# Patient Record
Sex: Female | Born: 1945 | Race: White | Hispanic: No | Smoking: Never smoker
Health system: Southern US, Community
[De-identification: ages and names within clinical notes are randomized; demographics above are authoritative.]

## PROBLEM LIST (undated history)

## (undated) DIAGNOSIS — M1990 Unspecified osteoarthritis, unspecified site: Secondary | ICD-10-CM

## (undated) DIAGNOSIS — C801 Malignant (primary) neoplasm, unspecified: Secondary | ICD-10-CM

## (undated) DIAGNOSIS — R011 Cardiac murmur, unspecified: Secondary | ICD-10-CM

## (undated) HISTORY — PX: NO PAST SURGERIES: SHX2092

## (undated) HISTORY — PX: TUBAL LIGATION: SHX77

## (undated) HISTORY — DX: Malignant (primary) neoplasm, unspecified: C80.1

## (undated) HISTORY — PX: TONSILLECTOMY: SUR1361

---

## 2004-11-03 ENCOUNTER — Ambulatory Visit (HOSPITAL_COMMUNITY): Admission: RE | Admit: 2004-11-03 | Discharge: 2004-11-03 | Payer: Self-pay | Admitting: Internal Medicine

## 2004-11-12 ENCOUNTER — Other Ambulatory Visit: Admission: RE | Admit: 2004-11-12 | Discharge: 2004-11-12 | Payer: Self-pay | Admitting: Internal Medicine

## 2004-11-18 ENCOUNTER — Encounter: Admission: RE | Admit: 2004-11-18 | Discharge: 2004-11-18 | Payer: Self-pay | Admitting: Internal Medicine

## 2004-12-23 ENCOUNTER — Ambulatory Visit (HOSPITAL_COMMUNITY): Admission: RE | Admit: 2004-12-23 | Discharge: 2004-12-23 | Payer: Self-pay | Admitting: *Deleted

## 2014-02-19 ENCOUNTER — Ambulatory Visit (INDEPENDENT_AMBULATORY_CARE_PROVIDER_SITE_OTHER): Payer: Medicare Other | Admitting: Family Medicine

## 2014-02-19 ENCOUNTER — Ambulatory Visit (INDEPENDENT_AMBULATORY_CARE_PROVIDER_SITE_OTHER): Payer: Medicare Other

## 2014-02-19 VITALS — BP 130/60 | HR 71 | Temp 97.7°F | Resp 16 | Ht 63.0 in | Wt 135.2 lb

## 2014-02-19 DIAGNOSIS — R238 Other skin changes: Secondary | ICD-10-CM

## 2014-02-19 DIAGNOSIS — M25511 Pain in right shoulder: Secondary | ICD-10-CM

## 2014-02-19 DIAGNOSIS — M7541 Impingement syndrome of right shoulder: Secondary | ICD-10-CM

## 2014-02-19 DIAGNOSIS — R209 Unspecified disturbances of skin sensation: Secondary | ICD-10-CM | POA: Diagnosis not present

## 2014-02-19 DIAGNOSIS — R200 Anesthesia of skin: Secondary | ICD-10-CM

## 2014-02-19 DIAGNOSIS — R233 Spontaneous ecchymoses: Secondary | ICD-10-CM

## 2014-02-19 DIAGNOSIS — R202 Paresthesia of skin: Secondary | ICD-10-CM

## 2014-02-19 DIAGNOSIS — J208 Acute bronchitis due to other specified organisms: Secondary | ICD-10-CM | POA: Diagnosis not present

## 2014-02-19 DIAGNOSIS — M75101 Unspecified rotator cuff tear or rupture of right shoulder, not specified as traumatic: Secondary | ICD-10-CM

## 2014-02-19 DIAGNOSIS — M25519 Pain in unspecified shoulder: Secondary | ICD-10-CM | POA: Diagnosis not present

## 2014-02-19 LAB — POCT CBC
Granulocyte percent: 65.7 % (ref 37–80)
HCT, POC: 42.5 % (ref 37.7–47.9)
Hemoglobin: 13.7 g/dL (ref 12.2–16.2)
Lymph, poc: 2.3 (ref 0.6–3.4)
MCH, POC: 28.9 pg (ref 27–31.2)
MCHC: 32.3 g/dL (ref 31.8–35.4)
MCV: 89.4 fL (ref 80–97)
MID (cbc): 0.7 (ref 0–0.9)
MPV: 7.2 fL (ref 0–99.8)
POC Granulocyte: 5.7 (ref 2–6.9)
POC LYMPH PERCENT: 26.6 %L (ref 10–50)
POC MID %: 7.7 %M (ref 0–12)
Platelet Count, POC: 237 10*3/uL (ref 142–424)
RBC: 4.75 M/uL (ref 4.04–5.48)
RDW, POC: 13.7 %
WBC: 8.7 10*3/uL (ref 4.6–10.2)

## 2014-02-19 MED ORDER — AZITHROMYCIN 250 MG PO TABS: ORAL_TABLET | ORAL | Status: DC

## 2014-02-19 NOTE — Progress Notes (Signed)
 Chief Complaint:  Chief Complaint  Patient presents with  . Shoulder Pain    x3 months; pt states she has been having problems with her right shoulder  . Nasal Congestion    x2 weeks  . Fever    x2 weeks; pt did not take her temp but knows she had an fever   . Chills    x2 weeks  . Cough    green phelgm    HPI: Amber Cooley is a 68 y.o. female who is here for :  1. Cold sxs started 8 weeks ago , lasted 6 weeks. She has not seen her PCP in a long time so they would consider her a new patient since she has not been there. The first cold she did not do much, she took cough meds and a lot of bufferin. She took 3 bottles of tussin and lots of  bufferin for  HA. She has bruises from the bufferin. Almost 2 weeks after her sxs improve  her sxs returned , she has had a runny nose, head feels stuffed up, she feels like she has the beginnings of a sore throat. She is not producing much when she coughs , mostly mucus from nose. She denies ear pain.  She has taken Tussie cough meds. She does not like taking medicines. She tried to take Vitamin C  But that has not helped. No CP or SOB, wheezing. She thinks she is UTD on her TDaP but not sure.   2.  Right shoulder pain has been bothering  her for the last 3-4 months. She used to sleep on it but has pain when she lies on it , she has not used it as much due to pain. THE pain is not just at night, it can be when she turns her arm or moves her shoulder the wrong way. She ahs 2 jobs, both involves a lot of lifting.  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 SHe has shoudler pain with lifting her coffee cup. She feels it in her hands, has had difficulty with grip of her coffee cup and also pouring of the . She has to used her left hand to stabilize her right arm. She denies any neck pain.   She has carpal tunnel like sxs also, sometimes the pain radiates from her wrist up to her elbow  She had precancer in her cervix in her early 37s . She had cryo and nothing else. Mother passed from ovarian Cancer.   PCP: Dr Minna Antis   Past Medical History  Diagnosis Date  . Cancer    Past Surgical History  Procedure Laterality Date  . Tubal ligation     History   Social History  . Marital Status: Divorced    Spouse Name: N/A    Number of Children: N/A  . Years of Education: N/A   Social History Main Topics  . Smoking status: Never Smoker   . Smokeless tobacco: None  . Alcohol Use: Yes  . Drug Use: No  . Sexual Activity: None   Other Topics Concern  . None   Social History Narrative  . None   Family History  Problem Relation Age of Onset  . Cancer Mother    No Known Allergies  Prior to Admission medications   Medication Sig Start Date End Date Taking? Authorizing Provider  Hydrocodone-Chlorpheniramine (CHLORPHENIRAMINE-HYDROCODONE PO) Take 5 mg by mouth.   Yes Historical Provider, MD  naproxen sodium (ANAPROX) 220 MG tablet Take 220 mg by mouth as needed.   Yes Historical Provider, MD     ROS: The patient denies night sweats, unintentional weight loss, chest pain, palpitations, wheezing, dyspnea on exertion, nausea, vomiting, abdominal pain, dysuria, hematuria, melena, + numbness, weakness, or tingling.   All other systems have been reviewed and were otherwise negative with the exception of those mentioned in the HPI and as above.    PHYSICAL EXAM: Filed Vitals:   02/19/14 1629  BP: 130/60  Pulse: 71  Temp: 97.7 F (36.5 C)  Resp: 16   Filed Vitals:   02/19/14 1629  Height: 5\' 3"  (1.6 m)  Weight: 135 lb 3.2 oz (61.326 kg)   Body mass index is 23.96 kg/(m^2).  General: Alert, no acute distress HEENT:  Normocephalic, atraumatic, oropharynx patent. EOMI, PERRLA Cardiovascular:  Regular rate and rhythm, no rubs murmurs or gallops.  No Carotid bruits, radial  pulse intact. No pedal edema.  Respiratory: Clear to auscultation bilaterally.  No wheezes, rales, or rhonchi.  No cyanosis, no use of accessory musculature GI: No organomegaly, abdomen is soft and non-tender, positive bowel sounds.  No masses. Skin: No rashes. Neurologic: Facial musculature symmetric. Psychiatric: Patient is appropriate throughout our interaction. Lymphatic: No cervical lymphadenopathy Musculoskeletal: Gait intact. Neck exam-full , + spurling Right shoulder-+ minimal swelling anterior aspect;  no hypo or hypertrophy, 5/5 strength, neg Empty can, + Hawkins/Neers, neg Speeds, decrease ROM in IR and ER Wrist-no appreciable deformity, + phalens, + tinels, 5/5/ strength, no appreciable msk wasting of thenar eminences,interosseus intact  LABS: Results for orders placed in visit on 02/19/14  POCT CBC      Result Value Ref Range   WBC 8.7  4.6 - 10.2 K/uL   Lymph, poc 2.3  0.6 - 3.4   POC LYMPH PERCENT 26.6  10 - 50 %L   MID (cbc) 0.7  0 - 0.9   POC MID % 7.7  0 - 12 %M   POC Granulocyte 5.7  2 - 6.9   Granulocyte percent 65.7  37 - 80 %G   RBC 4.75  4.04 - 5.48 M/uL   Hemoglobin 13.7  12.2 - 16.2 g/dL   HCT, POC 42.5  37.7 - 47.9 %   MCV 89.4  80 - 97 fL   MCH, POC 28.9  27 - 31.2 pg   MCHC 32.3  31.8 - 35.4 g/dL   RDW, POC 13.7     Platelet Count, POC 237  142 - 424 K/uL   MPV 7.2  0 - 99.8 fL     EKG/XRAY:   Primary read interpreted by Dr. Marin [facility] at [facility]. 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  . Pain in joint, shoulder region, right   . Numbness and tingling in right hand   . Easy bruising   . Paresthesia   . Acute bronchitis due to other specified organisms Yes  . Rotator cuff impingement syndrome, right    Mrs. Horn is a pleasant Right handed 68 year old female with recurrent  acute bronchitis and also  3 month hx of anterolateral shoulder pain and  also paresthesia to her arms and hands. The distributionis hard to localize since  she also has carpal tunnel like sxs and also a positive Spurling test on neck exam. I did not illicit any sxs or signs of ulanr nerve entrapment on history or exam. Since her paresthesia is impairing her function, she has to use her left hand to help with holding her coffee cup. I will go ahead and order a nerve conduction test. She states she would rather do that prior to going to see specialist.  Nerve conduction test pedning ROM exercises given for Rotator Cuff impingement. Does not want any pain meds Z pack given for long standing cough, will cover for pertussis in case her TDaP is not UTD.  OTC cough meds.  Labs pending: CMP , CBC was nl, platelets normal. Advise to avoid NSAIDs for now. IF CMP is normal then consider mobic prn for pain.  F/u in 1 month after nerve conduction test.   Gross sideeffects, risk and benefits, and alternatives of medications d/w patient. Patient is aware that all medications have potential sideeffects and we are unable to predict every sideeffect or drug-drug interaction that may occur.  , Farrell, DO 02/20/2014 3:25 PM

## 2014-02-19 NOTE — Patient Instructions (Signed)
Impingement Syndrome, Rotator Cuff, Bursitis with Rehab Impingement syndrome is a condition that involves inflammation of the tendons of the rotator cuff and the subacromial bursa, that causes pain in the shoulder. The rotator cuff consists of four tendons and muscles that control much of the shoulder and upper arm function. The subacromial bursa is a fluid filled sac that helps reduce friction between the rotator cuff and one of the bones of the shoulder (acromion). Impingement syndrome is usually an overuse injury that causes swelling of the bursa (bursitis), swelling of the tendon (tendonitis), and/or a tear of the tendon (strain). Strains are classified into three categories. Grade 1 strains cause pain, but the tendon is not lengthened. Grade 2 strains include a lengthened ligament, due to the ligament being stretched or partially ruptured. With grade 2 strains there is still function, although the function may be decreased. Grade 3 strains include a complete tear of the tendon or muscle, and function is usually impaired. SYMPTOMS   Pain around the shoulder, often at the outer portion of the upper arm.  Pain that gets worse with shoulder function, especially when reaching overhead or lifting.  Sometimes, aching when not using the arm.  Pain that wakes you up at night.  Sometimes, tenderness, swelling, warmth, or redness over the affected area.  Loss of strength.  Limited motion of the shoulder, especially reaching behind the back (to the back pocket or to unhook bra) or across your body.  Crackling sound (crepitation) when moving the arm.  Biceps tendon pain and inflammation (in the front of the shoulder). Worse when bending the elbow or lifting. CAUSES  Impingement syndrome is often an overuse injury, in which chronic (repetitive) motions cause the tendons or bursa to become inflamed. A strain occurs when a force is paced on the tendon or muscle that is greater than it can withstand.  Common mechanisms of injury include: Stress from sudden increase in duration, frequency, or intensity of training.  Direct hit (trauma) to the shoulder.  Aging, erosion of the tendon with normal use.  Bony bump on shoulder (acromial spur). RISK INCREASES WITH:  Contact sports (football, wrestling, boxing).  Throwing sports (baseball, tennis, volleyball).  Weightlifting and bodybuilding.  Heavy labor.  Previous injury to the rotator cuff, including impingement.  Poor shoulder strength and flexibility.  Failure to warm up properly before activity.  Inadequate protective equipment.  Old age.  Bony bump on shoulder (acromial spur). PREVENTION   Warm up and stretch properly before activity.  Allow for adequate recovery between workouts.  Maintain physical fitness:  Strength, flexibility, and endurance.  Cardiovascular fitness.  Learn and use proper exercise technique. PROGNOSIS  If treated properly, impingement syndrome usually goes away within 6 weeks. Sometimes surgery is required.  RELATED COMPLICATIONS   Longer healing time if not properly treated, or if not given enough time to heal.  Recurring symptoms, that result in a chronic condition.  Shoulder stiffness, frozen shoulder, or loss of motion.  Rotator cuff tendon tear.  Recurring symptoms, especially if activity is resumed too soon, with overuse, with a direct blow, or when using poor technique. TREATMENT  Treatment first involves the use of ice and medicine, to reduce pain and inflammation. The use of strengthening and stretching exercises may help reduce pain with activity. These exercises may be performed at home or with a therapist. If non-surgical treatment is unsuccessful after more than 6 months, surgery may be advised. After surgery and rehabilitation, activity is usually possible in 3 months.  MEDICATION  If pain medicine is needed, nonsteroidal anti-inflammatory medicines (aspirin and  ibuprofen), or other minor pain relievers (acetaminophen), are often advised.  Do not take pain medicine for 7 days before surgery.  Prescription pain relievers may be given, if your caregiver thinks they are needed. Use only as directed and only as much as you need.  Corticosteroid injections may be given by your caregiver. These injections should be reserved for the most serious cases, because they may only be given a certain number of times. HEAT AND COLD  Cold treatment (icing) should be applied for 10 to 15 minutes every 2 to 3 hours for inflammation and pain, and immediately after activity that aggravates your symptoms. Use ice packs or an ice massage.  Heat treatment may be used before performing stretching and strengthening activities prescribed by your caregiver, physical therapist, or athletic trainer. Use a heat pack or a warm water soak. SEEK MEDICAL CARE IF:   Symptoms get worse or do not improve in 4 to 6 weeks, despite treatment.  New, unexplained symptoms develop. (Drugs used in treatment may produce side effects.) EXERCISES  RANGE OF MOTION (ROM) AND STRETCHING EXERCISES - Impingement Syndrome (Rotator Cuff  Tendinitis, Bursitis) These exercises may help you when beginning to rehabilitate your injury. Your symptoms may go away with or without further involvement from your physician, physical therapist or athletic trainer. While completing these exercises, remember:   Restoring tissue flexibility helps normal motion to return to the joints. This allows healthier, less painful movement and activity.  An effective stretch should be held for at least 30 seconds.  A stretch should never be painful. You should only feel a gentle lengthening or release in the stretched tissue. STRETCH - Flexion, Standing  Stand with good posture. With an underhand grip on your right / left hand, and an overhand grip on the opposite hand, grasp a broomstick or cane so that your hands are a  little more than shoulder width apart.  Keeping your right / left elbow straight and shoulder muscles relaxed, push the stick with your opposite hand, to raise your right / left arm in front of your body and then overhead. Raise your arm until you feel a stretch in your right / left shoulder, but before you have increased shoulder pain.  Try to avoid shrugging your right / left shoulder as your arm rises, by keeping your shoulder blade tucked down and toward your mid-back spine. Hold for __________ seconds.  Slowly return to the starting position. Repeat __________ times. Complete this exercise __________ times per day. STRETCH - Abduction, Supine  Lie on your back. With an underhand grip on your right / left hand and an overhand grip on the opposite hand, grasp a broomstick or cane so that your hands are a little more than shoulder width apart.  Keeping your right / left elbow straight and your shoulder muscles relaxed, push the stick with your opposite hand, to raise your right / left arm out to the side of your body and then overhead. Raise your arm until you feel a stretch in your right / left shoulder, but before you have increased shoulder pain.  Try to avoid shrugging your right / left shoulder as your arm rises, by keeping your shoulder blade tucked down and toward your mid-back spine. Hold for __________ seconds.  Slowly return to the starting position. Repeat __________ times. Complete this exercise __________ times per day. ROM - Flexion, Active-Assisted  Lie on your back.  You may bend your knees for comfort.  Grasp a broomstick or cane so your hands are about shoulder width apart. Your right / left hand should grip the end of the stick, so that your hand is positioned "thumbs-up," as if you were about to shake hands.  Using your healthy arm to lead, raise your right / left arm overhead, until you feel a gentle stretch in your shoulder. Hold for __________ seconds.  Use the stick  to assist in returning your right / left arm to its starting position. Repeat __________ times. Complete this exercise __________ times per day.  ROM - Internal Rotation, Supine   Lie on your back on a firm surface. Place your right / left elbow about 60 degrees away from your side. Elevate your elbow with a folded towel, so that the elbow and shoulder are the same height.  Using a broomstick or cane and your strong arm, pull your right / left hand toward your body until you feel a gentle stretch, but no increase in your shoulder pain. Keep your shoulder and elbow in place throughout the exercise.  Hold for __________ seconds. Slowly return to the starting position. Repeat __________ times. Complete this exercise __________ times per day. STRETCH - Internal Rotation  Place your right / left hand behind your back, palm up.  Throw a towel or belt over your opposite shoulder. Grasp the towel with your right / left hand.  While keeping an upright posture, gently pull up on the towel, until you feel a stretch in the front of your right / left shoulder.  Avoid shrugging your right / left shoulder as your arm rises, by keeping your shoulder blade tucked down and toward your mid-back spine.  Hold for __________ seconds. Release the stretch, by lowering your healthy hand. Repeat __________ times. Complete this exercise __________ times per day. ROM - Internal Rotation   Using an underhand grip, grasp a stick behind your back with both hands.  While standing upright with good posture, slide the stick up your back until you feel a mild stretch in the front of your shoulder.  Hold for __________ seconds. Slowly return to your starting position. Repeat __________ times. Complete this exercise __________ times per day.  STRETCH - Posterior Shoulder Capsule   Stand or sit with good posture. Grasp your right / left elbow and draw it across your chest, keeping it at the same height as your  shoulder.  Pull your elbow, so your upper arm comes in closer to your chest. Pull until you feel a gentle stretch in the back of your shoulder.  Hold for __________ seconds. Repeat __________ times. Complete this exercise __________ times per day. STRENGTHENING EXERCISES - Impingement Syndrome (Rotator Cuff Tendinitis, Bursitis) These exercises may help you when beginning to rehabilitate your injury. They may resolve your symptoms with or without further involvement from your physician, physical therapist or athletic trainer. While completing these exercises, remember:  Muscles can gain both the endurance and the strength needed for everyday activities through controlled exercises.  Complete these exercises as instructed by your physician, physical therapist or athletic trainer. Increase the resistance and repetitions only as guided.  You may experience muscle soreness or fatigue, but the pain or discomfort you are trying to eliminate should never worsen during these exercises. If this pain does get worse, stop and make sure you are following the directions exactly. If the pain is still present after adjustments, discontinue the exercise until you can discuss   the trouble with your clinician.  During your recovery, avoid activity or exercises which involve actions that place your injured hand or elbow above your head or behind your back or head. These positions stress the tissues which you are trying to heal. STRENGTH - Scapular Depression and Adduction   With good posture, sit on a firm chair. Support your arms in front of you, with pillows, arm rests, or on a table top. Have your elbows in line with the sides of your body.  Gently draw your shoulder blades down and toward your mid-back spine. Gradually increase the tension, without tensing the muscles along the top of your shoulders and the back of your neck.  Hold for __________ seconds. Slowly release the tension and relax your muscles  completely before starting the next repetition.  After you have practiced this exercise, remove the arm support and complete the exercise in standing as well as sitting position. Repeat __________ times. Complete this exercise __________ times per day.  STRENGTH - Shoulder Abductors, Isometric  With good posture, stand or sit about 4-6 inches from a wall, with your right / left side facing the wall.  Bend your right / left elbow. Gently press your right / left elbow into the wall. Increase the pressure gradually, until you are pressing as hard as you can, without shrugging your shoulder or increasing any shoulder discomfort.  Hold for __________ seconds.  Release the tension slowly. Relax your shoulder muscles completely before you begin the next repetition. Repeat __________ times. Complete this exercise __________ times per day.  STRENGTH - External Rotators, Isometric  Keep your right / left elbow at your side and bend it 90 degrees.  Step into a door frame so that the outside of your right / left wrist can press against the door frame without your upper arm leaving your side.  Gently press your right / left wrist into the door frame, as if you were trying to swing the back of your hand away from your stomach. Gradually increase the tension, until you are pressing as hard as you can, without shrugging your shoulder or increasing any shoulder discomfort.  Hold for __________ seconds.  Release the tension slowly. Relax your shoulder muscles completely before you begin the next repetition. Repeat __________ times. Complete this exercise __________ times per day.  STRENGTH - Supraspinatus   Stand or sit with good posture. Grasp a __________ weight, or an exercise band or tubing, so that your hand is "thumbs-up," like you are shaking hands.  Slowly lift your right / left arm in a "V" away from your thigh, diagonally into the space between your side and straight ahead. Lift your hand to  shoulder height or as far as you can, without increasing any shoulder pain. At first, many people do not lift their hands above shoulder height.  Avoid shrugging your right / left shoulder as your arm rises, by keeping your shoulder blade tucked down and toward your mid-back spine.  Hold for __________ seconds. Control the descent of your hand, as you slowly return to your starting position. Repeat __________ times. Complete this exercise __________ times per day.  STRENGTH - External Rotators  Secure a rubber exercise band or tubing to a fixed object (table, pole) so that it is at the same height as your right / left elbow when you are standing or sitting on a firm surface.  Stand or sit so that the secured exercise band is at your uninjured side.  Bend   your right / left elbow 90 degrees. Place a folded towel or small pillow under your right / left arm, so that your elbow is a few inches away from your side.  Keeping the tension on the exercise band, pull it away from your body, as if pivoting on your elbow. Be sure to keep your body steady, so that the movement is coming only from your rotating shoulder.  Hold for __________ seconds. Release the tension in a controlled manner, as you return to the starting position. Repeat __________ times. Complete this exercise __________ times per day.  STRENGTH - Internal Rotators   Secure a rubber exercise band or tubing to a fixed object (table, pole) so that it is at the same height as your right / left elbow when you are standing or sitting on a firm surface.  Stand or sit so that the secured exercise band is at your right / left side.  Bend your elbow 90 degrees. Place a folded towel or small pillow under your right / left arm so that your elbow is a few inches away from your side.  Keeping the tension on the exercise band, pull it across your body, toward your stomach. Be sure to keep your body steady, so that the movement is coming only from  your rotating shoulder.  Hold for __________ seconds. Release the tension in a controlled manner, as you return to the starting position. Repeat __________ times. Complete this exercise __________ times per day.  STRENGTH - Scapular Protractors, Standing   Stand arms length away from a wall. Place your hands on the wall, keeping your elbows straight.  Begin by dropping your shoulder blades down and toward your mid-back spine.  To strengthen your protractors, keep your shoulder blades down, but slide them forward on your rib cage. It will feel as if you are lifting the back of your rib cage away from the wall. This is a subtle motion and can be challenging to complete. Ask your caregiver for further instruction, if you are not sure you are doing the exercise correctly.  Hold for __________ seconds. Slowly return to the starting position, resting the muscles completely before starting the next repetition. Repeat __________ times. Complete this exercise __________ times per day. STRENGTH - Scapular Protractors, Supine  Lie on your back on a firm surface. Extend your right / left arm straight into the air while holding a __________ weight in your hand.  Keeping your head and back in place, lift your shoulder off the floor.  Hold for __________ seconds. Slowly return to the starting position, and allow your muscles to relax completely before starting the next repetition. Repeat __________ times. Complete this exercise __________ times per day. STRENGTH - Scapular Protractors, Quadruped  Get onto your hands and knees, with your shoulders directly over your hands (or as close as you can be, comfortably).  Keeping your elbows locked, lift the back of your rib cage up into your shoulder blades, so your mid-back rounds out. Keep your neck muscles relaxed.  Hold this position for __________ seconds. Slowly return to the starting position and allow your muscles to relax completely before starting the  next repetition. Repeat __________ times. Complete this exercise __________ times per day.  STRENGTH - Scapular Retractors  Secure a rubber exercise band or tubing to a fixed object (table, pole), so that it is at the height of your shoulders when you are either standing, or sitting on a firm armless chair.  With a   palm down grip, grasp an end of the band in each hand. Straighten your elbows and lift your hands straight in front of you, at shoulder height. Step back, away from the secured end of the band, until it becomes tense.  Squeezing your shoulder blades together, draw your elbows back toward your sides, as you bend them. Keep your upper arms lifted away from your body throughout the exercise.  Hold for __________ seconds. Slowly ease the tension on the band, as you reverse the directions and return to the starting position. Repeat __________ times. Complete this exercise __________ times per day. STRENGTH - Shoulder Extensors   Secure a rubber exercise band or tubing to a fixed object (table, pole) so that it is at the height of your shoulders when you are either standing, or sitting on a firm armless chair.  With a thumbs-up grip, grasp an end of the band in each hand. Straighten your elbows and lift your hands straight in front of you, at shoulder height. Step back, away from the secured end of the band, until it becomes tense.  Squeezing your shoulder blades together, pull your hands down to the sides of your thighs. Do not allow your hands to go behind you.  Hold for __________ seconds. Slowly ease the tension on the band, as you reverse the directions and return to the starting position. Repeat __________ times. Complete this exercise __________ times per day.  STRENGTH - Scapular Retractors and External Rotators   Secure a rubber exercise band or tubing to a fixed object (table, pole) so that it is at the height as your shoulders, when you are either standing, or sitting on a  firm armless chair.  With a palm down grip, grasp an end of the band in each hand. Bend your elbows 90 degrees and lift your elbows to shoulder height, at your sides. Step back, away from the secured end of the band, until it becomes tense.  Squeezing your shoulder blades together, rotate your shoulders so that your upper arms and elbows remain stationary, but your fists travel upward to head height.  Hold for __________ seconds. Slowly ease the tension on the band, as you reverse the directions and return to the starting position. Repeat __________ times. Complete this exercise __________ times per day.  STRENGTH - Scapular Retractors and External Rotators, Rowing   Secure a rubber exercise band or tubing to a fixed object (table, pole) so that it is at the height of your shoulders, when you are either standing, or sitting on a firm armless chair.  With a palm down grip, grasp an end of the band in each hand. Straighten your elbows and lift your hands straight in front of you, at shoulder height. Step back, away from the secured end of the band, until it becomes tense.  Step 1: Squeeze your shoulder blades together. Bending your elbows, draw your hands to your chest, as if you are rowing a boat. At the end of this motion, your hands and elbow should be at shoulder height and your elbows should be out to your sides.  Step 2: Rotate your shoulders, to raise your hands above your head. Your forearms should be vertical and your upper arms should be horizontal.  Hold for __________ seconds. Slowly ease the tension on the band, as you reverse the directions and return to the starting position. Repeat __________ times. Complete this exercise __________ times per day.  STRENGTH - Scapular Depressors  Find a sturdy chair   without wheels, such as a dining room chair.  Keeping your feet on the floor, and your hands on the chair arms, lift your bottom up from the seat, and lock your elbows.  Keeping  your elbows straight, allow gravity to pull your body weight down. Your shoulders will rise toward your ears.  Raise your body against gravity by drawing your shoulder blades down your back, shortening the distance between your shoulders and ears. Although your feet should always maintain contact with the floor, your feet should progressively support less body weight, as you get stronger.  Hold for __________ seconds. In a controlled and slow manner, lower your body weight to begin the next repetition. Repeat __________ times. Complete this exercise __________ times per day.  Document Released: 05/03/2005 Document Revised: 07/26/2011 Document Reviewed: 08/15/2008 [facility] Patient Information 2015 [place], [place]. This information is not intended to replace advice given to you by your health care provider. Make sure you discuss any questions you have with your health care provider. Acute Bronchitis Bronchitis is inflammation of the airways that extend from the windpipe into the lungs (bronchi). The inflammation often causes mucus to develop. This leads to a cough, which is the most common symptom of bronchitis.  In acute bronchitis, the condition usually develops suddenly and goes away over time, usually in a couple weeks. Smoking, allergies, and asthma can make bronchitis worse. Repeated episodes of bronchitis may cause further lung problems.  CAUSES Acute bronchitis is most often caused by the same virus that causes a cold. The virus can spread from person to person (contagious) through coughing, sneezing, and touching contaminated objects. SIGNS AND SYMPTOMS   Cough.   Fever.   Coughing up mucus.   Body aches.   Chest congestion.   Chills.   Shortness of breath.   Sore throat.  DIAGNOSIS  Acute bronchitis is usually diagnosed through a physical exam. Your health care provider will also ask you questions about your medical history. Tests, such as chest X-rays, are sometimes  done to rule out other conditions.  TREATMENT  Acute bronchitis usually goes away in a couple weeks. Oftentimes, no medical treatment is necessary. Medicines are sometimes given for relief of fever or cough. Antibiotic medicines are usually not needed but may be prescribed in certain situations. In some cases, an inhaler may be recommended to help reduce shortness of breath and control the cough. A cool mist vaporizer may also be used to help thin bronchial secretions and make it easier to clear the chest.  HOME CARE INSTRUCTIONS  Get plenty of rest.   Drink enough fluids to keep your urine clear or pale yellow (unless you have a medical condition that requires fluid restriction). Increasing fluids may help thin your respiratory secretions (sputum) and reduce chest congestion, and it will prevent dehydration.   Take medicines only as directed by your health care provider.  If you were prescribed an antibiotic medicine, finish it all even if you start to feel better.  Avoid smoking and secondhand smoke. Exposure to cigarette smoke or irritating chemicals will make bronchitis worse. If you are a smoker, consider using nicotine gum or skin patches to help control withdrawal symptoms. Quitting smoking will help your lungs heal faster.   Reduce the chances of another bout of acute bronchitis by washing your hands frequently, avoiding people with cold symptoms, and trying not to touch your hands to your mouth, nose, or eyes.   Keep all follow-up visits as directed by your health care provider.  SEEK MEDICAL CARE IF: Your symptoms do not improve after 1 week of treatment.  SEEK IMMEDIATE MEDICAL CARE IF:  You develop an increased fever or chills.   You have chest pain.   You have severe shortness of breath.  You have bloody sputum.   You develop dehydration.  You faint or repeatedly feel like you are going to pass out.  You develop repeated vomiting.  You develop a severe  headache. MAKE SURE YOU:   Understand these instructions.  Will watch your condition.  Will get help right away if you are not doing well or get worse. Document Released: 06/10/2004 Document Revised: 09/17/2013 Document Reviewed: 10/24/2012 [facility] Patient Information 2015 [place], [place]. This information is not intended to replace advice given to you by your health care provider. Make sure you discuss any questions you have with your health care provider.

## 2014-02-20 LAB — COMPLETE METABOLIC PANEL WITH GFR
AST: 14 U/L (ref 0–37)
Albumin: 4.4 g/dL (ref 3.5–5.2)
Alkaline Phosphatase: 68 U/L (ref 39–117)
GFR, Est African American: 89 mL/min
GFR, Est Non African American: 77 mL/min

## 2014-02-20 LAB — COMPLETE METABOLIC PANEL WITHOUT GFR
ALT: 11 U/L (ref 0–35)
BUN: 18 mg/dL (ref 6–23)
CO2: 29 meq/L (ref 19–32)
Calcium: 9.5 mg/dL (ref 8.4–10.5)
Chloride: 102 meq/L (ref 96–112)
Creat: 0.79 mg/dL (ref 0.50–1.10)
Glucose, Bld: 90 mg/dL (ref 70–99)
Potassium: 4.8 meq/L (ref 3.5–5.3)
Sodium: 138 meq/L (ref 135–145)
Total Bilirubin: 0.4 mg/dL (ref 0.2–1.2)
Total Protein: 7.2 g/dL (ref 6.0–8.3)

## 2014-02-21 ENCOUNTER — Encounter: Payer: Self-pay | Admitting: Family Medicine

## 2015-09-02 IMAGING — CR DG CERVICAL SPINE 2 OR 3 VIEWS
2 series · 2 of 2 positions shown · non-contrast
Comparison: None.

CLINICAL DATA: Chronic neck and shoulder pain. Chest pain. Numbness
and tingling in right hand.

EXAM:
RIGHT SHOULDER - 2+ VIEW; CHEST - 2 VIEW; CERVICAL SPINE - 2-3 VIEW

[lateral]
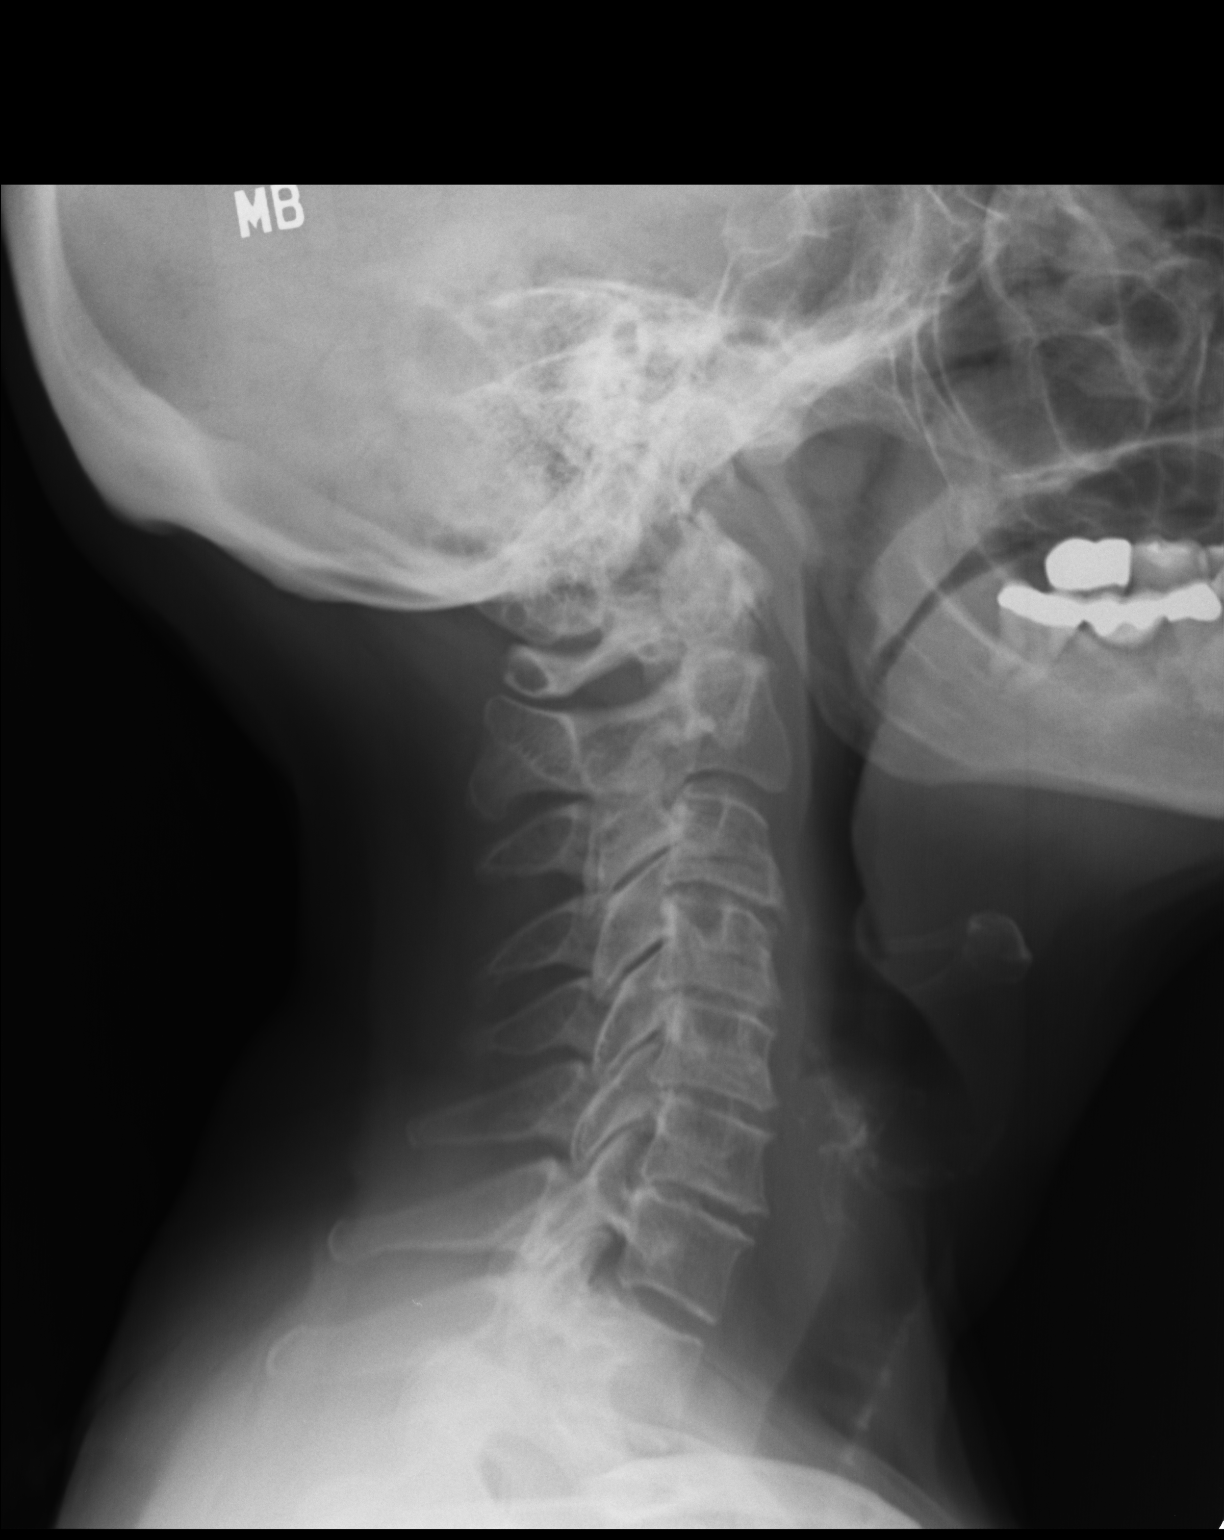

[AP]
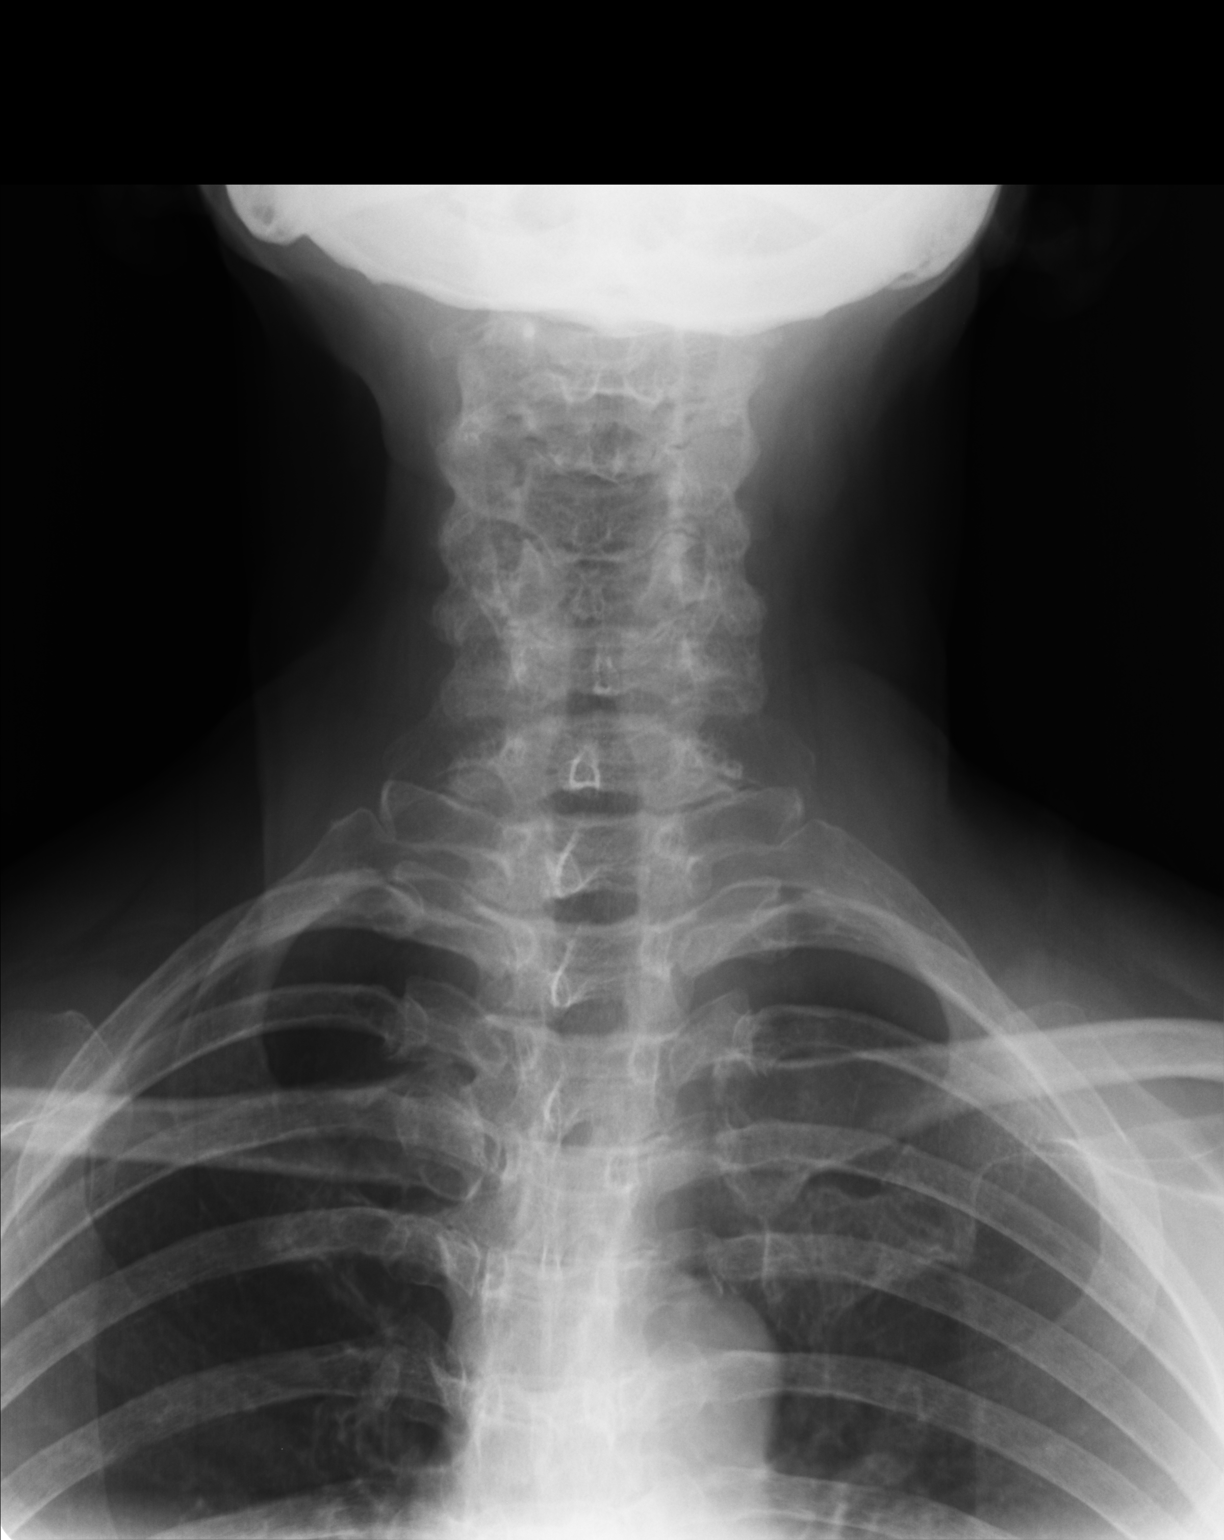

[2 of 2 positions shown; findings below may reference images not displayed]

FINDINGS: Two-view chest x-ray:

The cardiac silhouette, mediastinal and hilar contours are within
normal limits. The lungs are clear. No pleural effusion. The bony
thorax is intact.

Two view cervical spine:

Moderate degenerative cervical spondylosis with multilevel disc
disease and facet disease. Multilevel degenerative subluxations. No
acute bony findings or destructive bony changes. No abnormal
prevertebral soft tissue swelling. The facets are normally aligned.
The lung apices are clear. Small cervical ribs are noted.

Three-view right shoulder:

The joint spaces are maintained. Mild degenerative changes. No acute
fracture. The right lung is clear.
IMPRESSION: No acute cardiopulmonary findings.

Moderate to advanced degenerative cervical spondylosis but no acute
findings.

Mild degenerative changes but no acute abnormality involving the
right shoulder.

## 2015-09-02 IMAGING — CR DG SHOULDER 2+V*R*
3 series · 3 of 3 positions shown · non-contrast
Comparison: None.

CLINICAL DATA: Chronic neck and shoulder pain. Chest pain. Numbness
and tingling in right hand.

EXAM:
RIGHT SHOULDER - 2+ VIEW; CHEST - 2 VIEW; CERVICAL SPINE - 2-3 VIEW

[AP]
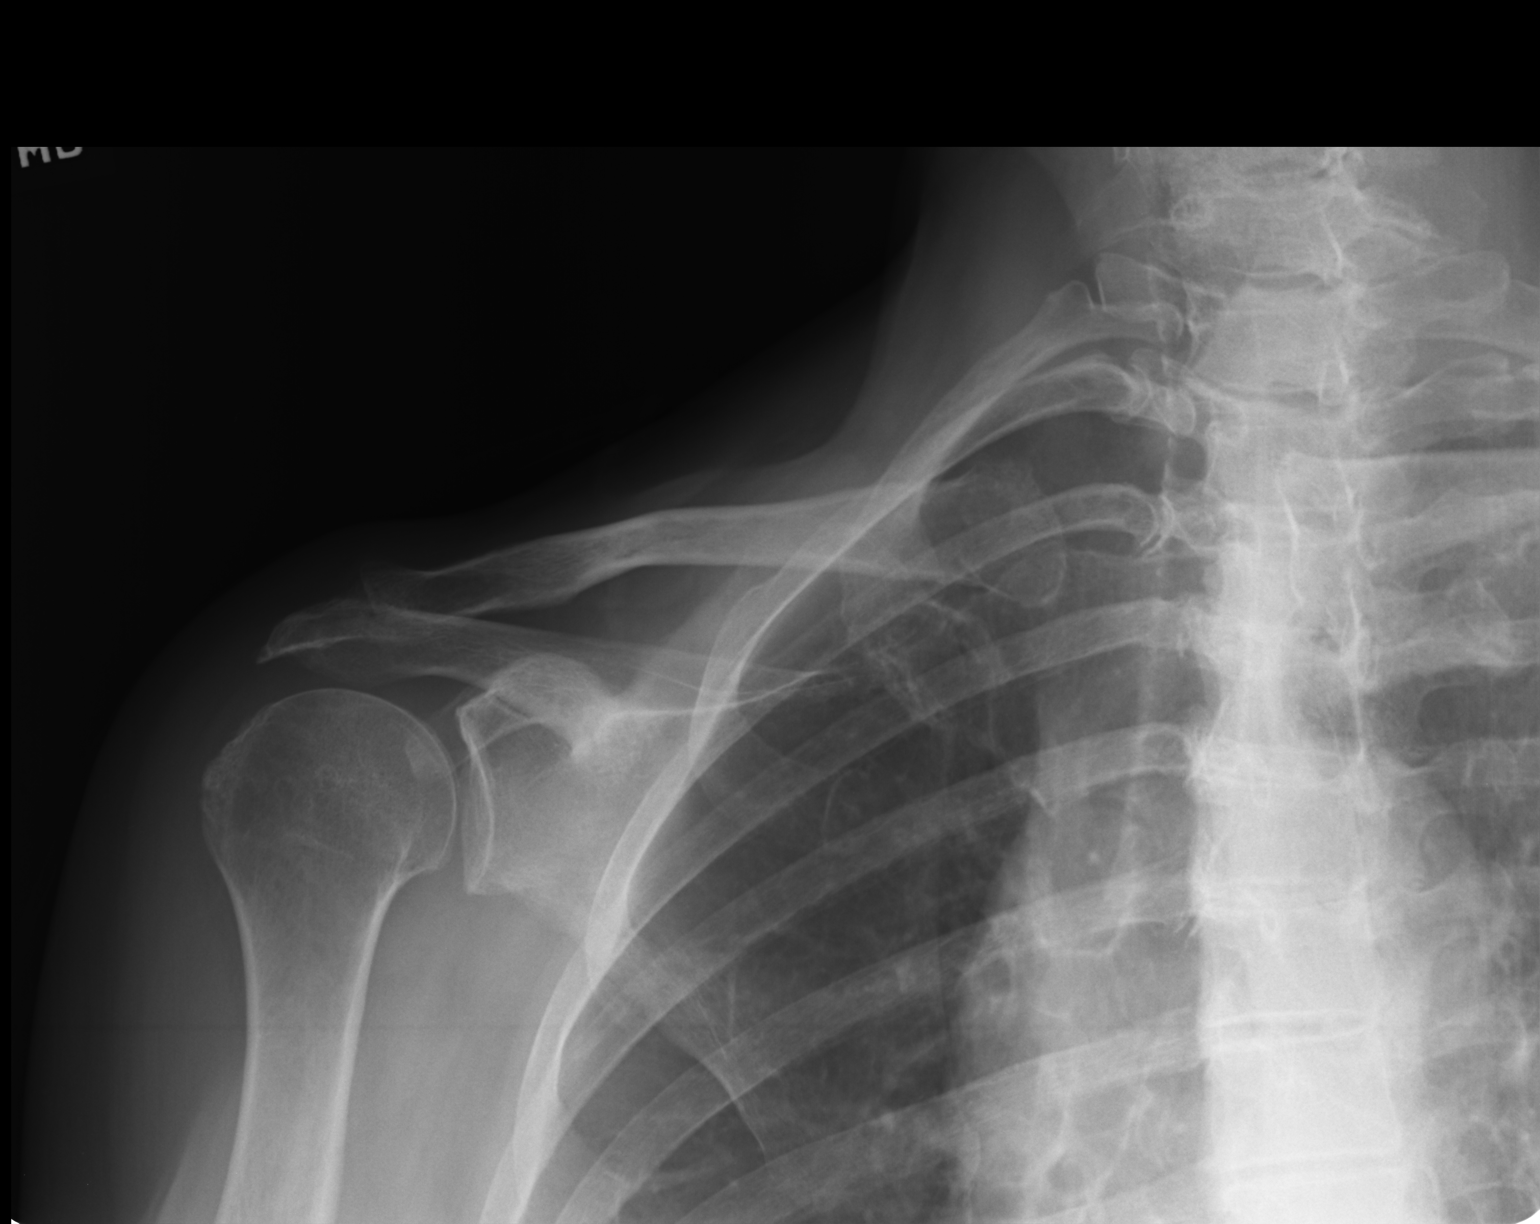

[ap ext rot]
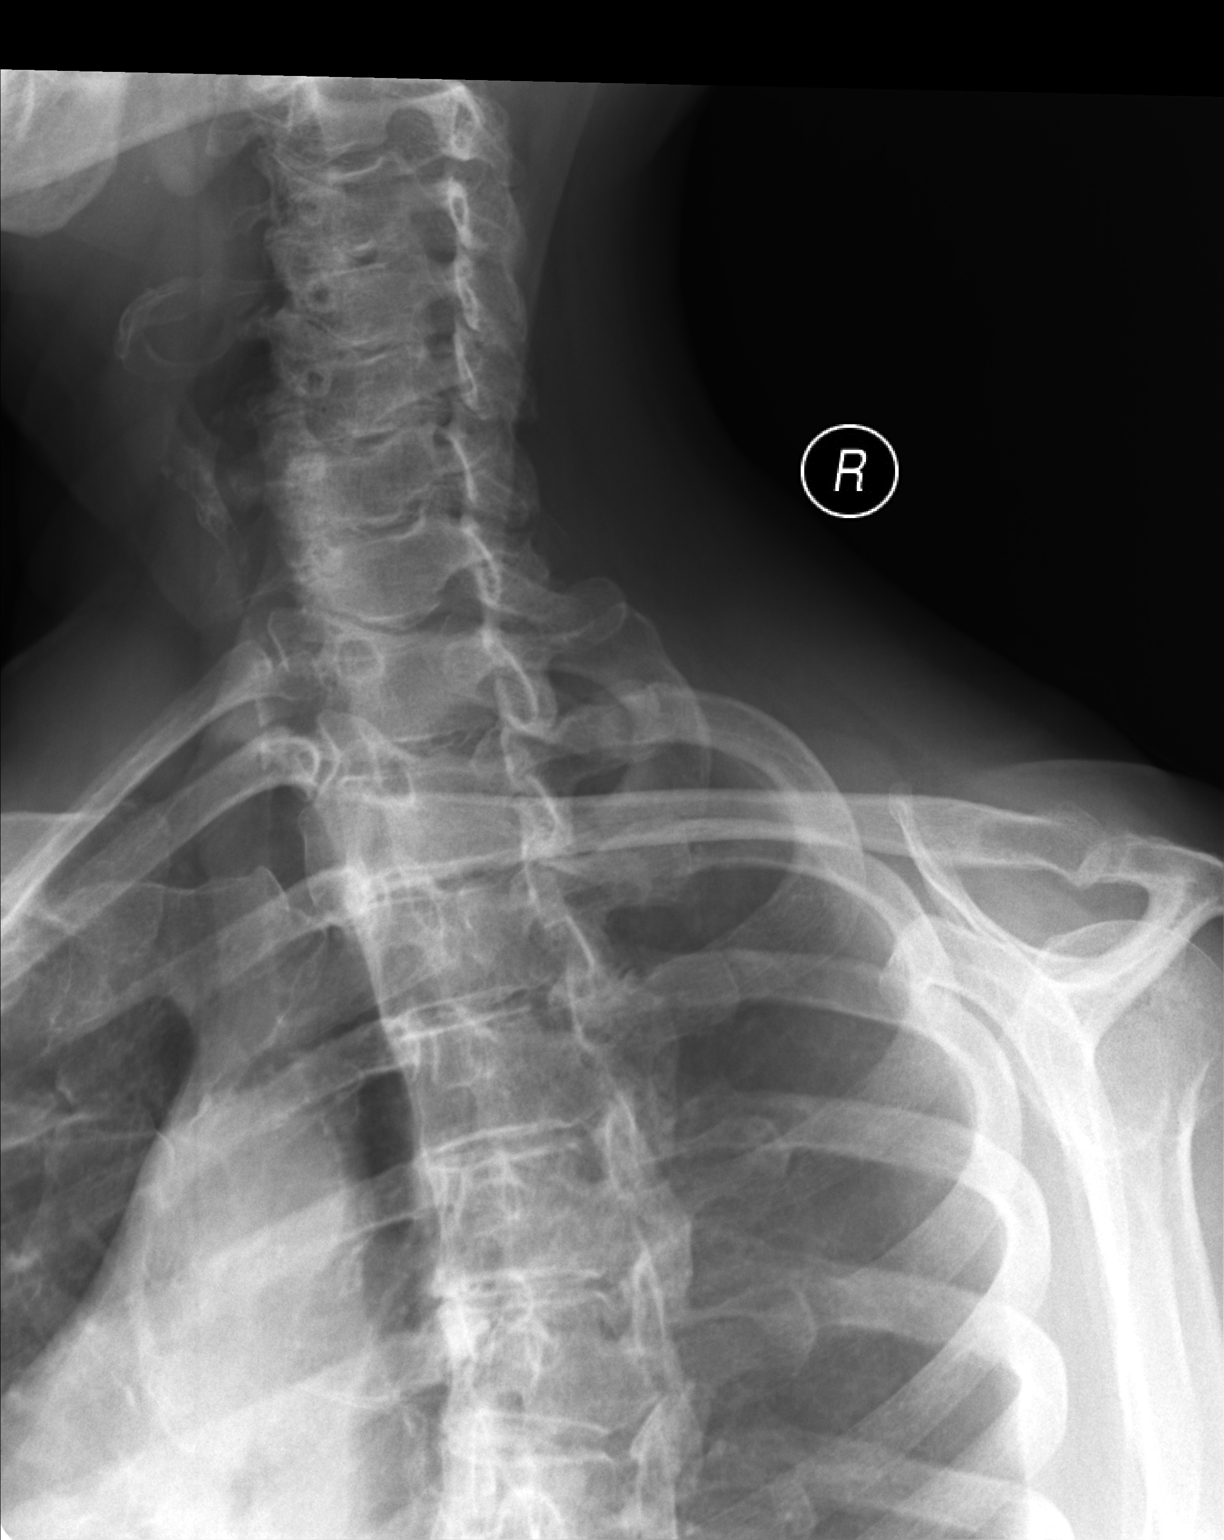

[ap int rot]
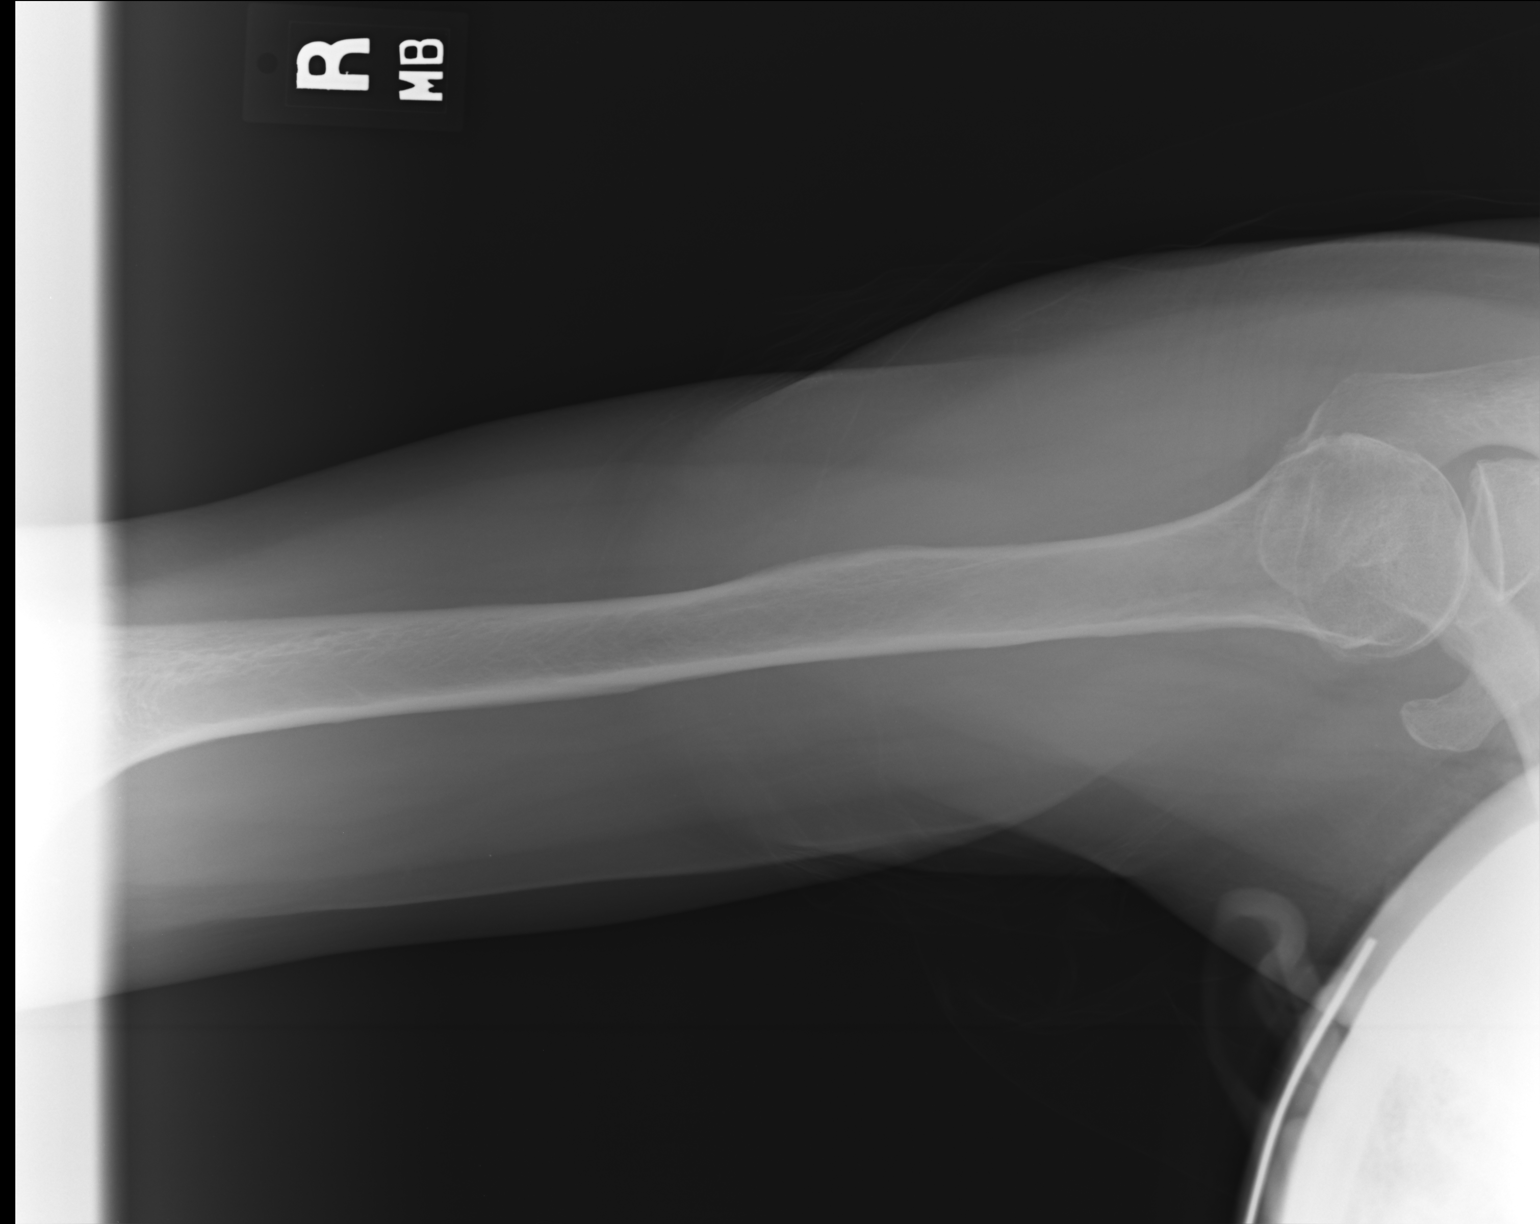

[3 of 3 positions shown; findings below may reference images not displayed]

FINDINGS: Two-view chest x-ray:

The cardiac silhouette, mediastinal and hilar contours are within
normal limits. The lungs are clear. No pleural effusion. The bony
thorax is intact.

Two view cervical spine:

Moderate degenerative cervical spondylosis with multilevel disc
disease and facet disease. Multilevel degenerative subluxations. No
acute bony findings or destructive bony changes. No abnormal
prevertebral soft tissue swelling. The facets are normally aligned.
The lung apices are clear. Small cervical ribs are noted.

Three-view right shoulder:

The joint spaces are maintained. Mild degenerative changes. No acute
fracture. The right lung is clear.
IMPRESSION: No acute cardiopulmonary findings.

Moderate to advanced degenerative cervical spondylosis but no acute
findings.

Mild degenerative changes but no acute abnormality involving the
right shoulder.

## 2016-06-08 DIAGNOSIS — H4322 Crystalline deposits in vitreous body, left eye: Secondary | ICD-10-CM | POA: Diagnosis not present

## 2016-06-08 DIAGNOSIS — H353111 Nonexudative age-related macular degeneration, right eye, early dry stage: Secondary | ICD-10-CM | POA: Diagnosis not present

## 2016-06-08 DIAGNOSIS — H353122 Nonexudative age-related macular degeneration, left eye, intermediate dry stage: Secondary | ICD-10-CM | POA: Diagnosis not present

## 2016-06-08 DIAGNOSIS — H35341 Macular cyst, hole, or pseudohole, right eye: Secondary | ICD-10-CM | POA: Diagnosis not present

## 2016-06-24 DIAGNOSIS — H35341 Macular cyst, hole, or pseudohole, right eye: Secondary | ICD-10-CM | POA: Diagnosis not present

## 2016-06-24 DIAGNOSIS — H35371 Puckering of macula, right eye: Secondary | ICD-10-CM | POA: Diagnosis not present

## 2016-06-25 DIAGNOSIS — H35341 Macular cyst, hole, or pseudohole, right eye: Secondary | ICD-10-CM | POA: Diagnosis not present

## 2016-07-06 DIAGNOSIS — H43822 Vitreomacular adhesion, left eye: Secondary | ICD-10-CM | POA: Diagnosis not present

## 2016-07-06 DIAGNOSIS — H35341 Macular cyst, hole, or pseudohole, right eye: Secondary | ICD-10-CM | POA: Diagnosis not present

## 2016-08-04 DIAGNOSIS — H35341 Macular cyst, hole, or pseudohole, right eye: Secondary | ICD-10-CM | POA: Diagnosis not present

## 2018-05-17 HISTORY — PX: EYE SURGERY: SHX253

## 2018-07-29 ENCOUNTER — Encounter (HOSPITAL_COMMUNITY): Payer: Self-pay | Admitting: Emergency Medicine

## 2018-07-29 ENCOUNTER — Other Ambulatory Visit: Payer: Self-pay

## 2018-07-29 ENCOUNTER — Emergency Department (HOSPITAL_COMMUNITY)
Admission: EM | Admit: 2018-07-29 | Discharge: 2018-07-29 | Disposition: A | Discharge status: Home / Self Care | Payer: Medicare Other | Attending: Emergency Medicine | Admitting: Emergency Medicine

## 2018-07-29 ENCOUNTER — Emergency Department (HOSPITAL_COMMUNITY): Payer: Medicare Other

## 2018-07-29 DIAGNOSIS — Y929 Unspecified place or not applicable: Secondary | ICD-10-CM | POA: Insufficient documentation

## 2018-07-29 DIAGNOSIS — Y939 Activity, unspecified: Secondary | ICD-10-CM | POA: Diagnosis not present

## 2018-07-29 DIAGNOSIS — S161XXA Strain of muscle, fascia and tendon at neck level, initial encounter: Secondary | ICD-10-CM | POA: Diagnosis not present

## 2018-07-29 DIAGNOSIS — Y999 Unspecified external cause status: Secondary | ICD-10-CM | POA: Diagnosis not present

## 2018-07-29 DIAGNOSIS — F0781 Postconcussional syndrome: Secondary | ICD-10-CM | POA: Diagnosis not present

## 2018-07-29 DIAGNOSIS — R51 Headache: Secondary | ICD-10-CM | POA: Diagnosis not present

## 2018-07-29 NOTE — ED Notes (Signed)
Patient verbalizes understanding of discharge instructions. Opportunity for questioning and answers were provided. Armband removed by staff, pt discharged from ED.  

## 2018-07-29 NOTE — Discharge Instructions (Signed)
Follow up with your family doc.    You most likely have a concussion, things that typically make this worse with things that you your brain like mathematics or language.  Follow-up with your family doctor as this can sometimes become a chronic problem.
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
## 2018-08-05 ENCOUNTER — Encounter (HOSPITAL_COMMUNITY): Payer: Self-pay | Admitting: *Deleted

## 2018-08-05 ENCOUNTER — Ambulatory Visit (HOSPITAL_COMMUNITY)
Admission: EM | Admit: 2018-08-05 | Discharge: 2018-08-05 | Disposition: A | Discharge status: Home / Self Care | Payer: Medicare Other | Attending: Physician Assistant | Admitting: Physician Assistant

## 2018-08-05 ENCOUNTER — Other Ambulatory Visit: Payer: Self-pay

## 2018-08-05 DIAGNOSIS — R05 Cough: Secondary | ICD-10-CM | POA: Diagnosis not present

## 2018-08-05 DIAGNOSIS — R059 Cough, unspecified: Secondary | ICD-10-CM

## 2018-08-05 DIAGNOSIS — R5383 Other fatigue: Secondary | ICD-10-CM

## 2018-08-05 MED ORDER — DOXYCYCLINE HYCLATE 100 MG PO CAPS: 100.0000 mg | ORAL_CAPSULE | Freq: Two times a day (BID) | ORAL | 0 refills | Status: AC

## 2018-08-05 NOTE — ED Triage Notes (Signed)
C/O cough, runny nose x 4 days; today feeling "very weak".  No known fever.  Also reports having had a tick bite 1 wk ago.

## 2018-08-05 NOTE — ED Provider Notes (Signed)
08/05/2018 3:14 PM   DOB: 03/23/1946 / MRN: [id number]  SUBJECTIVE:  Amber Cooley is a 73 y.o. female presenting for mild waxing and waning cough and sweats.  NO SOB or fever and fatigue.  Associates rhinorrhea.   She has No Known Allergies.   She  has a past medical history of Cancer ([place]).    She  reports that she has never smoked. She has never used smokeless tobacco. She reports current alcohol use of about 14.0 standard drinks of alcohol per week. She reports that she does not use drugs. She  has no history on file for sexual activity. The patient  has a past surgical history that includes Tubal ligation.  Her family history includes Cancer in her mother.  Review of Systems  Neurological: Negative for dizziness.   Per HPI.   OBJECTIVE:  BP 131/67   Pulse 88   Temp 98 F (36.7 C) (Oral)   Resp 20   SpO2 99%   Wt Readings from Last 3 Encounters:  07/29/18 133 lb (60.3 kg)  02/19/14 135 lb 3.2 oz (61.3 kg)   Temp Readings from Last 3 Encounters:  08/05/18 98 F (36.7 C) (Oral)  07/29/18 98.3 F (36.8 C) (Oral)  02/19/14 97.7 F (36.5 C) (Oral)   BP Readings from Last 3 Encounters:  08/05/18 131/67  07/29/18 (!) 149/73  02/19/14 130/60   Pulse Readings from Last 3 Encounters:  08/05/18 88  07/29/18 86  02/19/14 71    Physical Exam Vitals signs reviewed.  Constitutional:      General: She is not in acute distress.    Appearance: She is not diaphoretic.  Eyes:     Pupils: Pupils are equal, round, and reactive to light.  Cardiovascular:     Rate and Rhythm: Normal rate and regular rhythm.  Pulmonary:     Effort: Pulmonary effort is normal.     Breath sounds: Normal breath sounds.  Abdominal:     General: There is no distension.  Skin:    General: Skin is dry.  Neurological:     Mental Status: She is alert and oriented to person, place, and time.     Cranial Nerves: No cranial nerve deficit.     Gait: Gait normal.     No results found for this or any  previous visit (from the past 72 hour(s)).  No results found.  ASSESSMENT AND PLAN:   Cough  Other fatigue    Discharge Instructions     Start the doxy today.  Please stay home and self quarntine per the documentation I have provided.         The patient is advised to call or return to clinic if she does not see an improvement in symptoms, or to seek the care of the closest emergency department if she worsens with the above plan.   Philis Fendt, MHS, PA-C 08/05/2018 3:14 PM   Tereasa Coop, PA-C 08/05/18 1514

## 2018-08-05 NOTE — Discharge Instructions (Signed)
Start the doxy today.  Please stay home and self quarntine per the documentation I have provided.

## 2018-08-10 DIAGNOSIS — Z0289 Encounter for other administrative examinations: Secondary | ICD-10-CM | POA: Diagnosis not present

## 2018-08-10 DIAGNOSIS — W57XXXD Bitten or stung by nonvenomous insect and other nonvenomous arthropods, subsequent encounter: Secondary | ICD-10-CM | POA: Diagnosis not present

## 2018-08-10 DIAGNOSIS — R5383 Other fatigue: Secondary | ICD-10-CM | POA: Diagnosis not present

## 2018-11-14 DIAGNOSIS — E559 Vitamin D deficiency, unspecified: Secondary | ICD-10-CM | POA: Diagnosis not present

## 2018-11-14 DIAGNOSIS — R5383 Other fatigue: Secondary | ICD-10-CM | POA: Diagnosis not present

## 2018-11-14 DIAGNOSIS — E7800 Pure hypercholesterolemia, unspecified: Secondary | ICD-10-CM | POA: Diagnosis not present

## 2018-11-14 DIAGNOSIS — Z0000 Encounter for general adult medical examination without abnormal findings: Secondary | ICD-10-CM | POA: Diagnosis not present

## 2018-11-22 DIAGNOSIS — Z13820 Encounter for screening for osteoporosis: Secondary | ICD-10-CM | POA: Diagnosis not present

## 2018-11-22 DIAGNOSIS — E559 Vitamin D deficiency, unspecified: Secondary | ICD-10-CM | POA: Diagnosis not present

## 2018-11-22 DIAGNOSIS — E785 Hyperlipidemia, unspecified: Secondary | ICD-10-CM | POA: Diagnosis not present

## 2018-11-22 DIAGNOSIS — Z1211 Encounter for screening for malignant neoplasm of colon: Secondary | ICD-10-CM | POA: Diagnosis not present

## 2018-11-22 DIAGNOSIS — Z23 Encounter for immunization: Secondary | ICD-10-CM | POA: Diagnosis not present

## 2018-11-22 DIAGNOSIS — M1611 Unilateral primary osteoarthritis, right hip: Secondary | ICD-10-CM | POA: Diagnosis not present

## 2018-12-13 DIAGNOSIS — Z1212 Encounter for screening for malignant neoplasm of rectum: Secondary | ICD-10-CM | POA: Diagnosis not present

## 2018-12-13 DIAGNOSIS — Z1211 Encounter for screening for malignant neoplasm of colon: Secondary | ICD-10-CM | POA: Diagnosis not present

## 2018-12-21 DIAGNOSIS — M8589 Other specified disorders of bone density and structure, multiple sites: Secondary | ICD-10-CM | POA: Diagnosis not present

## 2019-04-24 DIAGNOSIS — H25013 Cortical age-related cataract, bilateral: Secondary | ICD-10-CM | POA: Diagnosis not present

## 2019-04-24 DIAGNOSIS — H25043 Posterior subcapsular polar age-related cataract, bilateral: Secondary | ICD-10-CM | POA: Diagnosis not present

## 2019-04-24 DIAGNOSIS — H2513 Age-related nuclear cataract, bilateral: Secondary | ICD-10-CM | POA: Diagnosis not present

## 2019-04-24 DIAGNOSIS — H353131 Nonexudative age-related macular degeneration, bilateral, early dry stage: Secondary | ICD-10-CM | POA: Diagnosis not present

## 2019-04-24 DIAGNOSIS — H18413 Arcus senilis, bilateral: Secondary | ICD-10-CM | POA: Diagnosis not present

## 2019-04-24 DIAGNOSIS — H2511 Age-related nuclear cataract, right eye: Secondary | ICD-10-CM | POA: Diagnosis not present

## 2019-05-28 DIAGNOSIS — H2511 Age-related nuclear cataract, right eye: Secondary | ICD-10-CM | POA: Diagnosis not present

## 2019-05-29 DIAGNOSIS — H2512 Age-related nuclear cataract, left eye: Secondary | ICD-10-CM | POA: Diagnosis not present

## 2019-07-09 DIAGNOSIS — H2512 Age-related nuclear cataract, left eye: Secondary | ICD-10-CM | POA: Diagnosis not present

## 2019-10-12 ENCOUNTER — Other Ambulatory Visit: Payer: Self-pay

## 2019-10-12 ENCOUNTER — Encounter (HOSPITAL_COMMUNITY): Payer: Self-pay

## 2019-10-12 ENCOUNTER — Ambulatory Visit (HOSPITAL_COMMUNITY)
Admission: EM | Admit: 2019-10-12 | Discharge: 2019-10-12 | Disposition: A | Discharge status: Home / Self Care | Payer: Medicare Other | Attending: Family Medicine | Admitting: Family Medicine

## 2019-10-12 DIAGNOSIS — L259 Unspecified contact dermatitis, unspecified cause: Secondary | ICD-10-CM | POA: Diagnosis not present

## 2019-10-12 DIAGNOSIS — R21 Rash and other nonspecific skin eruption: Secondary | ICD-10-CM

## 2019-10-12 MED ORDER — DEXAMETHASONE SODIUM PHOSPHATE 10 MG/ML IJ SOLN
10.0000 mg | Freq: Once | INTRAMUSCULAR | Status: AC
  Administered 2019-10-12: 10 mg via INTRAMUSCULAR

## 2019-10-12 MED ORDER — HYDROXYZINE HCL 25 MG PO TABS: 25.0000 mg | ORAL_TABLET | Freq: Four times a day (QID) | ORAL | 0 refills | Status: DC

## 2019-10-12 MED ORDER — PREDNISONE 10 MG (21) PO TBPK: ORAL_TABLET | Freq: Every day | ORAL | 0 refills | Status: AC

## 2019-10-12 MED ORDER — DEXAMETHASONE SODIUM PHOSPHATE 10 MG/ML IJ SOLN
INTRAMUSCULAR | Status: AC
  Filled 2019-10-12: qty 1

## 2019-10-12 NOTE — ED Triage Notes (Signed)
Pt is HOH bilaterally and forgot her hearing aids.  Pt c/o itchy rash to bilateral hands/arms. Pt states rash first appeared on her right thumb on Wednesday and then spread to bilateral arms. Pt reports she worked outside Monday pulling weeds. Pt states she also gave her dog a bath on Tuesday, but used a shampoo she has used before without complication.  Pt states she has been taking her grandson's hydroxyzine 10mg  tabs to help with itching.

## 2019-10-12 NOTE — ED Provider Notes (Signed)
[place]   [id number] 10/12/19 Arrival Time: 1232  CC: RASH  SUBJECTIVE:  Amber Cooley is a 74 y.o. female who presents with a skin complaint that began yesterday. Reports that she was working outside in her yard and then gave her dog a bath. Reports rash appeared to palms of hands, dorsal surface of hands, and both wrists. Has tried hydroxyzine for itching and triamcinolone as well. Denies medications change or starting a new medication recently.   Describes it as itchy. There are no aggravating factors. Denies similar symptoms in the past. Denies fever, chills, nausea, vomiting, erythema, swelling, discharge, oral lesions, SOB, chest pain, abdominal pain, changes in bowel or bladder function.    ROS: As per HPI.  All other pertinent ROS negative.     Past Medical History:  Diagnosis Date  . Cancer [facility])    Past Surgical History:  Procedure Laterality Date  . TUBAL LIGATION     No Known Allergies No current facility-administered medications on file prior to encounter.   Current Outpatient Medications on File Prior to Encounter  Medication Sig Dispense Refill  . naproxen sodium (ANAPROX) 220 MG tablet Take 220 mg by mouth 2 (two) times daily as needed (pain).      Social History   Socioeconomic History  . Marital status: Divorced    Spouse name: Not on file  . Number of children: Not on file  . Years of education: Not on file  . Highest education level: Not on file  Occupational History  . Not on file  Tobacco Use  . Smoking status: Never Smoker  . Smokeless tobacco: Never Used  Substance and Sexual Activity  . Alcohol use: Yes    Alcohol/week: 14.0 standard drinks    Types: 14 Glasses of wine per week  . Drug use: No  . Sexual activity: Not on file  Other Topics Concern  . Not on file  Social History Narrative  . Not on file   Social Determinants of Health   Financial Resource Strain:   . Difficulty of Paying Living Expenses:   Food Insecurity:    . Worried About Charity fundraiser in the Last Year:   . Arboriculturist in the Last Year:   Transportation Needs:   . Film/video editor (Medical):   Marland Kitchen Lack of Transportation (Non-Medical):   Physical Activity:   . Days of Exercise per Week:   . Minutes of Exercise per Session:   Stress:   . Feeling of Stress :   Social Connections:   . Frequency of Communication with Friends and Family:   . Frequency of Social Gatherings with Friends and Family:   . Attends Religious Services:   . Active Member of Clubs or Organizations:   . Attends Archivist Meetings:   Marland Kitchen Marital Status:   Intimate Partner Violence:   . Fear of Current or Ex-Partner:   . Emotionally Abused:   Marland Kitchen Physically Abused:   . Sexually Abused:    Family History  Problem Relation Age of Onset  . Cancer Mother     OBJECTIVE: Vitals:   10/12/19 1305 10/12/19 1307  BP:  133/67  Pulse: 89   Resp: 20   Temp: 98.8 F (37.1 C)   TempSrc: Oral   SpO2: 100%     General appearance: alert; no distress Head: NCAT Lungs: clear to auscultation bilaterally Heart: regular rate and rhythm.  Radial pulse 2+ bilaterally Extremities: no edema Skin: warm and  dry; vesicular rash with petechiae to both hands on palmar and dorsal surfaces; rash also present on bilateral wrists Psychological: alert and cooperative; normal mood and affect  ASSESSMENT & PLAN:  1. Contact dermatitis, unspecified contact dermatitis type, unspecified trigger   2. Rash     Meds ordered this encounter  Medications  . dexamethasone (DECADRON) injection 10 mg  . predniSONE (STERAPRED UNI-PAK 21 TAB) 10 MG (21) TBPK tablet    Sig: Take by mouth daily for 6 days. Take 6 tablets on day 1, 5 tablets on day 2, 4 tablets on day 3, 3 tablets on day 4, 2 tablets on day 5, 1 tablet on day 6    Dispense:  21 tablet    Refill:  0    Order Specific Question:   Supervising Provider    Answer:   Chase Picket [id number]  . hydrOXYzine  (ATARAX/VISTARIL) 25 MG tablet    Sig: Take 1 tablet (25 mg total) by mouth every 6 (six) hours.    Dispense:  12 tablet    Refill:  0    Order Specific Question:   Supervising Provider    Answer:   Chase Picket [id number]    Contact Dermatitis Gave Decadron 10mg  in office today Prescribed prednisone taper Hydroxyzine or benadryl at night for nighttime relief Triamcinolone 0.1% (corticosteroid - itch/ inflammation relief) Take as prescribed and to completion Avoid hot showers/ baths Moisturize skin daily  Follow up with PCP if symptoms persists Return or go to the ER if you have any new or worsening symptoms such as fever, chills, nausea, vomiting, redness, swelling, discharge, if symptoms do not improve with medications  Reviewed expectations re: course of current medical issues. Questions answered. Outlined signs and symptoms indicating need for more acute intervention. Patient verbalized understanding. After Visit Summary given.    Faustino Congress, NP 10/12/19 1431

## 2019-10-12 NOTE — Discharge Instructions (Addendum)
You have received a steroid injection in the office today.  I have sent in a prednisone taper for you to take as well.  Follow up as needed.

## 2019-10-18 DIAGNOSIS — L309 Dermatitis, unspecified: Secondary | ICD-10-CM | POA: Diagnosis not present

## 2019-10-18 DIAGNOSIS — R7989 Other specified abnormal findings of blood chemistry: Secondary | ICD-10-CM | POA: Diagnosis not present

## 2019-10-18 DIAGNOSIS — Z0000 Encounter for general adult medical examination without abnormal findings: Secondary | ICD-10-CM | POA: Diagnosis not present

## 2019-10-18 DIAGNOSIS — R7309 Other abnormal glucose: Secondary | ICD-10-CM | POA: Diagnosis not present

## 2019-10-23 DIAGNOSIS — R21 Rash and other nonspecific skin eruption: Secondary | ICD-10-CM | POA: Diagnosis not present

## 2019-11-22 DIAGNOSIS — Z13220 Encounter for screening for lipoid disorders: Secondary | ICD-10-CM | POA: Diagnosis not present

## 2019-11-22 DIAGNOSIS — R7309 Other abnormal glucose: Secondary | ICD-10-CM | POA: Diagnosis not present

## 2019-11-22 DIAGNOSIS — E7800 Pure hypercholesterolemia, unspecified: Secondary | ICD-10-CM | POA: Diagnosis not present

## 2019-11-22 DIAGNOSIS — Z0000 Encounter for general adult medical examination without abnormal findings: Secondary | ICD-10-CM | POA: Diagnosis not present

## 2019-11-22 DIAGNOSIS — R7989 Other specified abnormal findings of blood chemistry: Secondary | ICD-10-CM | POA: Diagnosis not present

## 2019-11-26 DIAGNOSIS — E7800 Pure hypercholesterolemia, unspecified: Secondary | ICD-10-CM | POA: Diagnosis not present

## 2019-11-26 DIAGNOSIS — Z0000 Encounter for general adult medical examination without abnormal findings: Secondary | ICD-10-CM | POA: Diagnosis not present

## 2019-11-26 DIAGNOSIS — R7309 Other abnormal glucose: Secondary | ICD-10-CM | POA: Diagnosis not present

## 2022-10-06 NOTE — Progress Notes (Signed)
Sent message, via epic in basket, requesting orders in epic from surgeon.  

## 2022-10-12 ENCOUNTER — Ambulatory Visit (HOSPITAL_COMMUNITY): Payer: Self-pay | Admitting: Emergency Medicine

## 2022-10-12 DIAGNOSIS — G8929 Other chronic pain: Secondary | ICD-10-CM

## 2022-10-12 NOTE — H&P (View-Only) (Signed)
TOTAL HIP ADMISSION H&P  Patient is admitted for right total hip arthroplasty.  Subjective:  Chief Complaint: right hip pain  HPI: Amber Cooley, 77 y.o. female, has a history of pain and functional disability in the right hip(s) due to arthritis and patient has failed non-surgical conservative treatments for greater than 12 weeks to include NSAID's and/or analgesics, corticosteriod injections, and activity modification.  Onset of symptoms was gradual starting 2 years ago with gradually worsening course since that time.The patient noted no past surgery on the bilaterally hip(s).  Patient currently rates pain in the right hip at 10 out of 10 with activity. Patient has night pain, worsening of pain with activity and weight bearing, pain that interfers with activities of daily living, and pain with passive range of motion. Patient has evidence of periarticular osteophytes and joint space narrowing by imaging studies. This condition presents safety issues increasing the risk of falls.  There is no current active infection.  There are no problems to display for this patient.  Past Medical History:  Diagnosis Date   Cancer (HCC)     Past Surgical History:  Procedure Laterality Date   TUBAL LIGATION      Current Outpatient Medications  Medication Sig Dispense Refill Last Dose   hydrOXYzine (ATARAX/VISTARIL) 25 MG tablet Take 1 tablet (25 mg total) by mouth every 6 (six) hours. 12 tablet 0    naproxen sodium (ANAPROX) 220 MG tablet Take 220 mg by mouth 2 (two) times daily as needed (pain).       No current facility-administered medications for this visit.   No Known Allergies  Social History   Tobacco Use   Smoking status: Never   Smokeless tobacco: Never  Substance Use Topics   Alcohol use: Yes    Alcohol/week: 14.0 standard drinks of alcohol    Types: 14 Glasses of wine per week    Family History  Problem Relation Age of Onset   Cancer Mother      Review of Systems   Musculoskeletal:  Positive for arthralgias.  All other systems reviewed and are negative.   Objective:  Physical Exam Constitutional:      General: She is not in acute distress.    Appearance: Normal appearance. She is not ill-appearing.  HENT:     Head: Normocephalic and atraumatic.     Right Ear: External ear normal.     Left Ear: External ear normal.     Nose: Nose normal.     Mouth/Throat:     Mouth: Mucous membranes are moist.     Pharynx: Oropharynx is clear.  Eyes:     Extraocular Movements: Extraocular movements intact.     Conjunctiva/sclera: Conjunctivae normal.  Cardiovascular:     Rate and Rhythm: Normal rate and regular rhythm.     Pulses: Normal pulses.     Heart sounds: Murmur heard.  Pulmonary:     Effort: Pulmonary effort is normal.     Breath sounds: Normal breath sounds.  Abdominal:     General: Bowel sounds are normal.     Palpations: Abdomen is soft.     Tenderness: There is no abdominal tenderness.  Musculoskeletal:        General: Tenderness present.     Cervical back: Normal range of motion and neck supple.     Comments: Right hip with mild greater trochanter and groin tenderness to palpation.  BLE appear grossly neurovascularly intact.  Dorsiflexion and Plantarflexion intact.  No lesion over area of chief   complaint.  Decreased internal rotation of right hip and pain elicited with ROM in all directions.  Skin:    General: Skin is warm and dry.  Neurological:     Mental Status: She is alert and oriented to person, place, and time. Mental status is at baseline.  Psychiatric:        Mood and Affect: Mood normal.        Behavior: Behavior normal.     Vital signs in last 24 hours: @VSRANGES@  Labs:   Estimated body mass index is 23.56 kg/m as calculated from the following:   Height as of 07/29/18: 5' 3" (1.6 m).   Weight as of 07/29/18: 60.3 kg.   Imaging Review Plain radiographs demonstrate severe degenerative joint disease of the right  hip(s). The bone quality appears to be good for age and reported activity level.      Assessment/Plan:  End stage arthritis, right hip(s)  The patient history, physical examination, clinical judgement of the provider and imaging studies are consistent with end stage degenerative joint disease of the right hip(s) and total hip arthroplasty is deemed medically necessary. The treatment options including medical management, injection therapy, arthroscopy and arthroplasty were discussed at length. The risks and benefits of total hip arthroplasty were presented and reviewed. The risks due to aseptic loosening, infection, stiffness, dislocation/subluxation,  thromboembolic complications and other imponderables were discussed.  The patient acknowledged the explanation, agreed to proceed with the plan and consent was signed. Patient is being admitted for inpatient treatment for surgery, pain control, PT, OT, prophylactic antibiotics, VTE prophylaxis, progressive ambulation and ADL's and discharge planning.The patient is planning to be discharged home with outpatient PT.    Patient's anticipated LOS is less than 2 midnights, meeting these requirements: - Younger than 65 - Lives within 1 hour of care - Has a competent adult at home to recover with post-op recover - NO history of  - Chronic pain requiring opiods  - Diabetes  - Coronary Artery Disease  - Heart failure  - Heart attack  - Stroke  - DVT/VTE  - Cardiac arrhythmia  - Respiratory Failure/COPD  - Renal failure  - Anemia  - Advanced Liver disease   

## 2022-10-12 NOTE — H&P (Signed)
TOTAL HIP ADMISSION H&P  Patient is admitted for right total hip arthroplasty.  Subjective:  Chief Complaint: right hip pain  HPI: Amber Cooley, 77 y.o. female, has a history of pain and functional disability in the right hip(s) due to arthritis and patient has failed non-surgical conservative treatments for greater than 12 weeks to include NSAID's and/or analgesics, corticosteriod injections, and activity modification.  Onset of symptoms was gradual starting 2 years ago with gradually worsening course since that time.The patient noted no past surgery on the bilaterally hip(s).  Patient currently rates pain in the right hip at 10 out of 10 with activity. Patient has night pain, worsening of pain with activity and weight bearing, pain that interfers with activities of daily living, and pain with passive range of motion. Patient has evidence of periarticular osteophytes and joint space narrowing by imaging studies. This condition presents safety issues increasing the risk of falls.  There is no current active infection.  There are no problems to display for this patient.  Past Medical History:  Diagnosis Date   Cancer [facility])     Past Surgical History:  Procedure Laterality Date   TUBAL LIGATION      Current Outpatient Medications  Medication Sig Dispense Refill Last Dose   hydrOXYzine (ATARAX/VISTARIL) 25 MG tablet Take 1 tablet (25 mg total) by mouth every 6 (six) hours. 12 tablet 0    naproxen sodium (ANAPROX) 220 MG tablet Take 220 mg by mouth 2 (two) times daily as needed (pain).       No current facility-administered medications for this visit.   No Known Allergies  Social History   Tobacco Use   Smoking status: Never   Smokeless tobacco: Never  Substance Use Topics   Alcohol use: Yes    Alcohol/week: 14.0 standard drinks of alcohol    Types: 14 Glasses of wine per week    Family History  Problem Relation Age of Onset   Cancer Mother      Review of Systems   Musculoskeletal:  Positive for arthralgias.  All other systems reviewed and are negative.   Objective:  Physical Exam Constitutional:      General: She is not in acute distress.    Appearance: Normal appearance. She is not ill-appearing.  HENT:     Head: Normocephalic and atraumatic.     Right Ear: External ear normal.     Left Ear: External ear normal.     Nose: Nose normal.     Mouth/Throat:     Mouth: Mucous membranes are moist.     Pharynx: Oropharynx is clear.  Eyes:     Extraocular Movements: Extraocular movements intact.     Conjunctiva/sclera: Conjunctivae normal.  Cardiovascular:     Rate and Rhythm: Normal rate and regular rhythm.     Pulses: Normal pulses.     Heart sounds: Murmur heard.  Pulmonary:     Effort: Pulmonary effort is normal.     Breath sounds: Normal breath sounds.  Abdominal:     General: Bowel sounds are normal.     Palpations: Abdomen is soft.     Tenderness: There is no abdominal tenderness.  Musculoskeletal:        General: Tenderness present.     Cervical back: Normal range of motion and neck supple.     Comments: Right hip with mild greater trochanter and groin tenderness to palpation.  BLE appear grossly neurovascularly intact.  Dorsiflexion and Plantarflexion intact.  No lesion over area of chief  complaint.  Decreased internal rotation of right hip and pain elicited with ROM in all directions.  Skin:    General: Skin is warm and dry.  Neurological:     Mental Status: She is alert and oriented to person, place, and time. Mental status is at baseline.  Psychiatric:        Mood and Affect: Mood normal.        Behavior: Behavior normal.     Vital signs in last 24 hours: @VSRANGES @  Labs:   Estimated body mass index is 23.56 kg/m as calculated from the following:   Height as of 07/29/18: 5\' 3"  (1.6 m).   Weight as of 07/29/18: 60.3 kg.   Imaging Review Plain radiographs demonstrate severe degenerative joint disease of the right  hip(s). The bone quality appears to be good for age and reported activity level.      Assessment/Plan:  End stage arthritis, right hip(s)  The patient history, physical examination, clinical judgement of the provider and imaging studies are consistent with end stage degenerative joint disease of the right hip(s) and total hip arthroplasty is deemed medically necessary. The treatment options including medical management, injection therapy, arthroscopy and arthroplasty were discussed at length. The risks and benefits of total hip arthroplasty were presented and reviewed. The risks due to aseptic loosening, infection, stiffness, dislocation/subluxation,  thromboembolic complications and other imponderables were discussed.  The patient acknowledged the explanation, agreed to proceed with the plan and consent was signed. Patient is being admitted for inpatient treatment for surgery, pain control, PT, OT, prophylactic antibiotics, VTE prophylaxis, progressive ambulation and ADL's and discharge planning.The patient is planning to be discharged home with outpatient PT.    Patient's anticipated LOS is less than 2 midnights, meeting these requirements: - Younger than 91 - Lives within 1 hour of care - Has a competent adult at home to recover with post-op recover - NO history of  - Chronic pain requiring opiods  - Diabetes  - Coronary Artery Disease  - Heart failure  - Heart attack  - Stroke  - DVT/VTE  - Cardiac arrhythmia  - Respiratory Failure/COPD  - Renal failure  - Anemia  - Advanced Liver disease

## 2022-10-13 NOTE — Patient Instructions (Signed)
SURGICAL WAITING ROOM VISITATION Patients having surgery or a procedure may have no more than 2 support people in the waiting area - these visitors may rotate in the visitor waiting room.   Due to an increase in RSV and influenza rates and associated hospitalizations, children ages 38 and under may not visit patients in [facility] hospitals. If the patient needs to stay at the hospital during part of their recovery, the visitor guidelines for inpatient rooms apply.  PRE-OP VISITATION  Pre-op nurse will coordinate an appropriate time for 1 support person to accompany the patient in pre-op.  This support person may not rotate.  This visitor will be contacted when the time is appropriate for the visitor to come back in the pre-op area.  Please refer to the [facility] website for the visitor guidelines for Inpatients (after your surgery is over and you are in a regular room).  You are not required to quarantine at this time prior to your surgery. However, you must do this: Hand Hygiene often Do NOT share personal items Notify your provider if you are in close contact with someone who has COVID or you develop fever 100.4 or greater, new onset of sneezing, cough, sore throat, shortness of breath or body aches.  If you test positive for Covid or have been in contact with anyone that has tested positive in the last 10 days please notify you surgeon.    Your procedure is scheduled on:   Wednesday  October 27, 2022  Report to [facility] Main Entrance: [facility]sen entrance where the [employer] is available.   Report to admitting at: 07:15    AM  +++++Call this number if you have any questions or problems the morning of surgery [phone]  Do not eat food after Midnight the night prior to your surgery/procedure.  After Midnight you may have the following liquids until   06:45  AM  DAY OF SURGERY  Clear Liquid Diet Water Black Coffee (sugar ok, NO MILK/CREAM OR CREAMERS)  Tea (sugar ok,  NO MILK/CREAM OR CREAMERS) regular and decaf                             Plain Jell-O  with no fruit (NO RED)                                           Fruit ices (not with fruit pulp, NO RED)                                     Popsicles (NO RED)                                                                  Juice: apple, WHITE grape, WHITE cranberry Sports drinks like Gatorade or Powerade (NO RED)                     The day of surgery:  Drink ONE (1) Pre-Surgery Clear Ensure at   06:45  AM the  morning of surgery. Drink in one sitting. Do not sip.  This drink was given to you during your hospital pre-op appointment visit. Nothing else to drink after completing the Pre-Surgery Clear Ensure : No candy, chewing gum or throat lozenges.    FOLLOW ANY ADDITIONAL PRE OP INSTRUCTIONS YOU RECEIVED FROM YOUR SURGEON'S OFFICE!!!   Oral Hygiene is also important to reduce your risk of infection.        Remember - BRUSH YOUR TEETH THE MORNING OF SURGERY WITH YOUR REGULAR TOOTHPASTE  Do NOT smoke after Midnight the night before surgery.  Take ONLY these medicines the morning of surgery with A SIP OF WATER: Hydroxyzine if needed                    You may not have any metal on your body including hair pins, jewelry, and body piercing  Do not wear make-up, lotions, powders, perfumes or deodorant  Do not wear nail polish including gel and S&S, artificial / acrylic nails, or any other type of covering on natural nails including finger and toenails. If you have artificial nails, gel coating, etc., that needs to be removed by a nail salon, Please have this removed prior to surgery. Not doing so may mean that your surgery could be cancelled or delayed if the Surgeon or anesthesia staff feels like they are unable to monitor you safely.   Do not shave 48 hours prior to surgery to avoid nicks in your skin which may contribute to postoperative infections.   Men may shave face and neck.  Contacts,  Hearing Aids, dentures or bridgework may not be worn into surgery. DENTURES WILL BE REMOVED PRIOR TO SURGERY PLEASE DO NOT APPLY "Poly grip" OR ADHESIVES!!!  Patients discharged on the day of surgery will not be allowed to drive home.  Someone NEEDS to stay with you for the first 24 hours after anesthesia.  Do not bring your home medications to the hospital. The Pharmacy will dispense medications listed on your medication list to you during your admission in the Hospital.  Special Instructions: Bring a copy of your healthcare power of attorney and living will documents the day of surgery, if you wish to have them scanned into your [place] [facility] Records- EPIC  Please read over the following fact sheets you were given: IF YOU HAVE QUESTIONS ABOUT YOUR PRE-OP INSTRUCTIONS, PLEASE CALL [phone].         Pre-operative 5 CHG Bath Instructions   You can play a key role in reducing the risk of infection after surgery. Your skin needs to be as free of germs as possible. You can reduce the number of germs on your skin by washing with CHG (chlorhexidine gluconate) soap before surgery. CHG is an antiseptic soap that kills germs and continues to kill germs even after washing.   DO NOT use if you have an allergy to chlorhexidine/CHG or antibacterial soaps. If your skin becomes reddened or irritated, stop using the CHG and notify one of our RNs at [phone]  Please shower with the CHG soap starting 4 days before surgery using the following schedule: START SHOWERS ON SATURDAY  October 23, 2022  Please keep in mind the following:  DO NOT shave, including legs and underarms, starting the day of your first shower.   You may shave your face at any point before/day of surgery.   Place clean sheets on your bed the day  you start using CHG soap. Use a clean washcloth (not used since being washed) for each shower. DO NOT sleep with pets once you start using the CHG.   CHG Shower Instructions:  If you choose to wash your hair and private area, wash first with your normal shampoo/soap.  After you use shampoo/soap, rinse your hair and body thoroughly to remove shampoo/soap residue.  Turn the water OFF and apply about 3 tablespoons (45 ml) of CHG soap to a CLEAN washcloth.  Apply CHG soap ONLY FROM YOUR NECK DOWN TO YOUR TOES (washing for 3-5 minutes)  DO NOT use CHG soap on face, private areas, open wounds, or sores.  Pay special attention to the area where your surgery is being performed.  If you are having back surgery, having someone wash your back for you may be helpful.  Wait 2 minutes after CHG soap is applied, then you may rinse off the CHG soap.  Pat dry with a clean towel  Put on clean clothes/pajamas   If you choose to wear lotion, please use ONLY the CHG-compatible lotions on the back of this paper.     Additional instructions for the day of surgery: DO NOT APPLY any lotions, deodorants, cologne, or perfumes.   Put on clean/comfortable clothes.  Brush your teeth.  Ask your nurse before applying any prescription medications to the skin.      CHG Compatible Lotions   Aveeno Moisturizing lotion  Cetaphil Moisturizing Cream  Cetaphil Moisturizing Lotion  Clairol Herbal Essence Moisturizing Lotion, Dry Skin  Clairol Herbal Essence Moisturizing Lotion, Extra Dry Skin  Clairol Herbal Essence Moisturizing Lotion, Normal Skin  Curel Age Defying Therapeutic Moisturizing Lotion with Alpha Hydroxy  Curel Extreme Care Body Lotion  Curel Soothing Hands Moisturizing Hand Lotion  Curel Therapeutic Moisturizing Cream, Fragrance-Free  Curel Therapeutic Moisturizing Lotion, Fragrance-Free  Curel Therapeutic Moisturizing Lotion, Original Formula  Eucerin Daily Replenishing Lotion  Eucerin Dry Skin  Therapy Plus Alpha Hydroxy Crme  Eucerin Dry Skin Therapy Plus Alpha Hydroxy Lotion  Eucerin Original Crme  Eucerin Original Lotion  Eucerin Plus Crme Eucerin Plus Lotion  Eucerin TriLipid Replenishing Lotion  Keri Anti-Bacterial Hand Lotion  Keri Deep Conditioning Original Lotion Dry Skin Formula Softly Scented  Keri Deep Conditioning Original Lotion, Fragrance Free Sensitive Skin Formula  Keri Lotion Fast Absorbing Fragrance Free Sensitive Skin Formula  Keri Lotion Fast Absorbing Softly Scented Dry Skin Formula  Keri Original Lotion  Keri Skin Renewal Lotion Keri Silky Smooth Lotion  Keri Silky Smooth Sensitive Skin Lotion  Nivea Body Creamy Conditioning Oil  Nivea Body Extra Enriched Lotion  Nivea Body Original Lotion  Nivea Body Sheer Moisturizing Lotion Nivea Crme  Nivea Skin Firming Lotion  NutraDerm 30 Skin Lotion  NutraDerm Skin Lotion  NutraDerm Therapeutic Skin Cream  NutraDerm Therapeutic Skin Lotion  ProShield Protective Hand Cream  Provon moisturizing lotion  ON THE DAY OF SURGERY : Do not apply any lotions/deodorants the morning of surgery.  Please wear clean clothes to the hospital/surgery center.    FAILURE TO FOLLOW THESE INSTRUCTIONS MAY RESULT IN THE CANCELLATION OF YOUR SURGERY  PATIENT SIGNATURE_________________________________  NURSE SIGNATURE__________________________________  ________________________________________________________________________        Rogelia Mire    An incentive  spirometer is a tool that can help keep your lungs clear and active. This tool measures how well you are filling your lungs with each breath. Taking long deep breaths may help reverse or decrease the chance of developing breathing (pulmonary) problems (especially infection) following: A long period of time when you are unable to move or be active. BEFORE THE PROCEDURE  If the spirometer includes an indicator to show your best effort, your nurse or  respiratory therapist will set it to a desired goal. If possible, sit up straight or lean slightly forward. Try not to slouch. Hold the incentive spirometer in an upright position. INSTRUCTIONS FOR USE  Sit on the edge of your bed if possible, or sit up as far as you can in bed or on a chair. Hold the incentive spirometer in an upright position. Breathe out normally. Place the mouthpiece in your mouth and seal your lips tightly around it. Breathe in slowly and as deeply as possible, raising the piston or the ball toward the top of the column. Hold your breath for 3-5 seconds or for as long as possible. Allow the piston or ball to fall to the bottom of the column. Remove the mouthpiece from your mouth and breathe out normally. Rest for a few seconds and repeat Steps 1 through 7 at least 10 times every 1-2 hours when you are awake. Take your time and take a few normal breaths between deep breaths. The spirometer may include an indicator to show your best effort. Use the indicator as a goal to work toward during each repetition. After each set of 10 deep breaths, practice coughing to be sure your lungs are clear. If you have an incision (the cut made at the time of surgery), support your incision when coughing by placing a pillow or rolled up towels firmly against it. Once you are able to get out of bed, walk around indoors and cough well. You may stop using the incentive spirometer when instructed by your caregiver.  RISKS AND COMPLICATIONS Take your time so you do not get dizzy or light-headed. If you are in pain, you may need to take or ask for pain medication before doing incentive spirometry. It is harder to take a deep breath if you are having pain. AFTER USE Rest and breathe slowly and easily. It can be helpful to keep track of a log of your progress. Your caregiver can provide you with a simple table to help with this. If you are using the spirometer at home, follow these  instructions: SEEK MEDICAL CARE IF:  You are having difficultly using the spirometer. You have trouble using the spirometer as often as instructed. Your pain medication is not giving enough relief while using the spirometer. You develop fever of 100.5 F (38.1 C) or higher.                                                                                                    SEEK IMMEDIATE MEDICAL CARE IF:  You cough up bloody sputum that had not been present before. You develop fever of  102 F (38.9 C) or greater. You develop worsening pain at or near the incision site. MAKE SURE YOU:  Understand these instructions. Will watch your condition. Will get help right away if you are not doing well or get worse. Document Released: 09/13/2006 Document Revised: 07/26/2011 Document Reviewed: 11/14/2006 [facility] Patient Information 2014 [place], [place].      WHAT IS A BLOOD TRANSFUSION? Blood Transfusion Information  A transfusion is the replacement of blood or some of its parts. Blood is made up of multiple cells which provide different functions. Red blood cells carry oxygen and are used for blood loss replacement. White blood cells fight against infection. Platelets control bleeding. Plasma helps clot blood. Other blood products are available for specialized needs, such as hemophilia or other clotting disorders. BEFORE THE TRANSFUSION  Who gives blood for transfusions?  Healthy volunteers who are fully evaluated to make sure their blood is safe. This is blood bank blood. Transfusion therapy is the safest it has ever been in the practice of medicine. Before blood is taken from a donor, a complete history is taken to make sure that person has no history of diseases nor engages in risky social behavior (examples are intravenous drug use or sexual activity with multiple partners). The donor's travel history is screened to minimize risk of transmitting infections, such as malaria. The donated  blood is tested for signs of infectious diseases, such as HIV and hepatitis. The blood is then tested to be sure it is compatible with you in order to minimize the chance of a transfusion reaction. If you or a relative donates blood, this is often done in anticipation of surgery and is not appropriate for emergency situations. It takes many days to process the donated blood. RISKS AND COMPLICATIONS Although transfusion therapy is very safe and saves many lives, the main dangers of transfusion include:  Getting an infectious disease. Developing a transfusion reaction. This is an allergic reaction to something in the blood you were given. Every precaution is taken to prevent this. The decision to have a blood transfusion has been considered carefully by your caregiver before blood is given. Blood is not given unless the benefits outweigh the risks. AFTER THE TRANSFUSION Right after receiving a blood transfusion, you will usually feel much better and more energetic. This is especially true if your red blood cells have gotten low (anemic). The transfusion raises the level of the red blood cells which carry oxygen, and this usually causes an energy increase. The nurse administering the transfusion will monitor you carefully for complications. HOME CARE INSTRUCTIONS  No special instructions are needed after a transfusion. You may find your energy is better. Speak with your caregiver about any limitations on activity for underlying diseases you may have. SEEK MEDICAL CARE IF:  Your condition is not improving after your transfusion. You develop redness or irritation at the intravenous (IV) site. SEEK IMMEDIATE MEDICAL CARE IF:  Any of the following symptoms occur over the next 12 hours: Shaking chills. You have a temperature by mouth above 102 F (38.9 C), not controlled by medicine. Chest, back, or muscle pain. People around you feel you are not acting correctly or are confused. Shortness of breath or  difficulty breathing. Dizziness and fainting. You get a rash or develop hives. You have a decrease in urine output. Your urine turns a dark color or changes to pink, red, or brown. Any of the following symptoms occur over the next 10 days: You have a temperature by mouth above  102 F (38.9 C), not controlled by medicine. Shortness of breath. Weakness after normal activity. The white part of the eye turns yellow (jaundice). You have a decrease in the amount of urine or are urinating less often. Your urine turns a dark color or changes to pink, red, or brown. Document Released: 04/30/2000 Document Revised: 07/26/2011 Document Reviewed: 12/18/2007 [facility] Patient Information 2014 [place], [place].        _______________________________________________________________________

## 2022-10-13 NOTE — Progress Notes (Addendum)
COVID Vaccine received:  []  No [x]  Yes Date of any COVID positive Test in last 90 days: none  PCP - Dr Irena Reichmann Cardiologist -  none  seeing Dr. Yates Decamp on 10-15-22 for cardiac clearance- heart murmur.   Chest x-ray -  EKG - Will do at PST Stress Test -  ECHO -  Cardiac Cath -   PCR screen: [x]  Ordered & Completed           []   No Order but Needs PROFEND           []   N/A for this surgery  Surgery Plan:  [x]  Ambulatory                            []  Outpatient in bed                            []  Admit  Anesthesia:    []  General  []  Spinal                           [x]   Choice []   MAC  Pacemaker / ICD device [x]  No []  Yes   Spinal Cord Stimulator:[x]  No []  Yes       History of Sleep Apnea? [x]  No []  Yes   CPAP used?- [x]  No []  Yes    Does the patient monitor blood sugar?          []  No []  Yes  [x]  N/A  Patient has: [x]  NO Hx DM   []  Pre-DM                 []  DM1  []   DM2  Blood Thinner / Instructions:  none Aspirin Instructions:  none  ERAS Protocol Ordered: []  No  [x]  Yes PRE-SURGERY [x]  ENSURE  []  G2  Patient is to be NPO after: 06:45 am  Patient was given the 5 CHG shower / bath instructions for THA / TKA / Total or Reverse Shoulder arthroplasty surgery along with 2 bottles of the CHG soap. Patient will start this on:  Saturday  October 23, 2022.  All questions were asked and answered, Patient voiced understanding of this process.   Activity level: Patient is able to climb a flight of stairs without difficulty; [x]  No CP  [x]  No SOB, but would have leg pain, Patient can perform ADLs without assistance.   Anesthesia review: anxiety,  no other medical or surgical history available. Patient has no PCP( just established with Irena Reichmann, DO) Will see Dr. Jacinto Halim on 10-15-2022 for murmur that was heard recently.   Patient denies shortness of breath, fever, cough and chest pain at PAT appointment.  Patient verbalized understanding and agreement to the Pre-Surgical  Instructions that were given to them at this PAT appointment. Patient was also educated of the need to review these PAT instructions again prior to her surgery.I reviewed the appropriate phone numbers to call if they have any and questions or concerns.

## 2022-10-14 ENCOUNTER — Other Ambulatory Visit: Payer: Self-pay

## 2022-10-14 ENCOUNTER — Encounter (HOSPITAL_COMMUNITY)
Admission: RE | Admit: 2022-10-14 | Discharge: 2022-10-14 | Disposition: A | Discharge status: Home / Self Care | Payer: Medicare (Managed Care) | Source: Ambulatory Visit | Attending: Orthopedic Surgery | Admitting: Orthopedic Surgery

## 2022-10-14 ENCOUNTER — Encounter (HOSPITAL_COMMUNITY): Payer: Self-pay

## 2022-10-14 VITALS — BP 139/75 | HR 86 | Temp 98.2°F | Resp 16 | Ht 63.0 in | Wt 127.0 lb

## 2022-10-14 DIAGNOSIS — G8929 Other chronic pain: Secondary | ICD-10-CM | POA: Diagnosis not present

## 2022-10-14 DIAGNOSIS — M25551 Pain in right hip: Secondary | ICD-10-CM | POA: Diagnosis not present

## 2022-10-14 DIAGNOSIS — M1611 Unilateral primary osteoarthritis, right hip: Secondary | ICD-10-CM | POA: Insufficient documentation

## 2022-10-14 DIAGNOSIS — Z01818 Encounter for other preprocedural examination: Secondary | ICD-10-CM | POA: Diagnosis present

## 2022-10-14 DIAGNOSIS — R011 Cardiac murmur, unspecified: Secondary | ICD-10-CM | POA: Diagnosis not present

## 2022-10-14 HISTORY — DX: Cardiac murmur, unspecified: R01.1

## 2022-10-14 HISTORY — DX: Unspecified osteoarthritis, unspecified site: M19.90

## 2022-10-14 LAB — COMPREHENSIVE METABOLIC PANEL
ALT: 16 U/L (ref 0–44)
AST: 14 U/L — ABNORMAL LOW (ref 15–41)
Albumin: 4.2 g/dL (ref 3.5–5.0)
Alkaline Phosphatase: 71 U/L (ref 38–126)
Anion gap: 11 (ref 5–15)
BUN: 16 mg/dL (ref 8–23)
CO2: 22 mmol/L (ref 22–32)
Calcium: 9.2 mg/dL (ref 8.9–10.3)
Chloride: 105 mmol/L (ref 98–111)
Creatinine, Ser: 0.6 mg/dL (ref 0.44–1.00)
GFR, Estimated: >60 mL/min (ref >60)
Glucose, Bld: 93 mg/dL (ref 70–99)
Potassium: 4.3 mmol/L (ref 3.5–5.1)
Sodium: 138 mmol/L (ref 135–145)
Total Bilirubin: 0.9 mg/dL (ref 0.3–1.2)
Total Protein: 7.3 g/dL (ref 6.5–8.1)

## 2022-10-14 LAB — TYPE AND SCREEN
ABO/RH(D): A POS
Antibody Screen: NEGATIVE

## 2022-10-14 LAB — CBC WITH DIFFERENTIAL/PLATELET
Abs Immature Granulocytes: 0.02 10*3/uL (ref 0.00–0.07)
Basophils Absolute: 0 10*3/uL (ref 0.0–0.1)
Basophils Relative: 1 %
Eosinophils Absolute: 0.1 10*3/uL (ref 0.0–0.5)
Eosinophils Relative: 1 %
HCT: 39.5 % (ref 36.0–46.0)
Hemoglobin: 12.6 g/dL (ref 12.0–15.0)
Immature Granulocytes: 0 %
Lymphocytes Relative: 25 %
Lymphs Abs: 1.4 10*3/uL (ref 0.7–4.0)
MCH: 28.8 pg (ref 26.0–34.0)
MCHC: 31.9 g/dL (ref 30.0–36.0)
MCV: 90.2 fL (ref 80.0–100.0)
Monocytes Absolute: 0.4 10*3/uL (ref 0.1–1.0)
Monocytes Relative: 6 %
Neutro Abs: 3.8 10*3/uL (ref 1.7–7.7)
Neutrophils Relative %: 67 %
Platelets: 233 10*3/uL (ref 150–400)
RBC: 4.38 MIL/uL (ref 3.87–5.11)
RDW: 13.2 % (ref 11.5–15.5)
WBC: 5.7 10*3/uL (ref 4.0–10.5)
nRBC: 0 % (ref 0.0–0.2)

## 2022-10-14 LAB — SURGICAL PCR SCREEN
MRSA, PCR: NEGATIVE
Staphylococcus aureus: NEGATIVE

## 2022-10-15 ENCOUNTER — Ambulatory Visit: Payer: Medicare (Managed Care) | Admitting: Cardiology

## 2022-10-15 ENCOUNTER — Encounter: Payer: Self-pay | Admitting: Cardiology

## 2022-10-15 ENCOUNTER — Encounter: Payer: Self-pay | Admitting: Family Medicine

## 2022-10-15 VITALS — BP 165/82 | HR 80 | Resp 16 | Ht 63.0 in | Wt 137.0 lb

## 2022-10-15 DIAGNOSIS — Z01810 Encounter for preprocedural cardiovascular examination: Secondary | ICD-10-CM

## 2022-10-15 DIAGNOSIS — R030 Elevated blood-pressure reading, without diagnosis of hypertension: Secondary | ICD-10-CM

## 2022-10-15 DIAGNOSIS — I351 Nonrheumatic aortic (valve) insufficiency: Secondary | ICD-10-CM

## 2022-10-15 MED ORDER — AMLODIPINE BESYLATE 5 MG PO TABS
5.0000 mg | ORAL_TABLET | Freq: Every day | ORAL | 2 refills | Status: AC
Start: 2022-10-15 — End: 2023-01-13

## 2022-10-15 NOTE — Progress Notes (Unsigned)
Primary Physician/Referring:  Irena Reichmann, DO  Patient ID: Amber Cooley, female    DOB: 1945/09/26, 77 y.o.   MRN: [id number]  No chief complaint on file.  HPI:    Amber Cooley  is a 77 y.o. ***  Past Medical History:  Diagnosis Date   Arthritis    Heart murmur    heard at preop with Dr. Blanchie Dessert   Past Surgical History:  Procedure Laterality Date   EYE SURGERY Bilateral 2020   cataract w/ IOL   NO PAST SURGERIES     TONSILLECTOMY     TUBAL LIGATION     Family History  Problem Relation Age of Onset   Cancer Mother    Chronic Renal Failure Father    Heart attack Maternal Grandmother    Stroke Maternal Grandmother    Cancer Paternal Grandfather     Social History   Tobacco Use   Smoking status: Never   Smokeless tobacco: Never  Substance Use Topics   Alcohol use: Yes    Alcohol/week: 1.0 standard drink of alcohol    Types: 1 Glasses of wine per week    Comment: occasionally   Marital Status: Divorced  ROS  ***Review of Systems  Cardiovascular:  Negative for chest pain, dyspnea on exertion and leg swelling.   Objective      10/15/2022   11:14 AM 10/15/2022    9:58 AM 10/14/2022   11:44 AM  Vitals with BMI  Height  5\' 3"  5\' 3"   Weight  137 lbs 127 lbs  BMI  24.27 22.5  Systolic 165 175 409  Diastolic 82 87 75  Pulse  80 86   SpO2: 99 %   ***Physical Exam Neck:     Vascular: No JVD.  Cardiovascular:     Rate and Rhythm: Normal rate and regular rhythm.     Pulses: Intact distal pulses.     Heart sounds: S1 normal and S2 normal. Murmur heard.     Early systolic murmur is present with a grade of 2/6 at the upper right sternal border.     No gallop.  Pulmonary:     Effort: Pulmonary effort is normal.     Breath sounds: Normal breath sounds.  Abdominal:     General: Bowel sounds are normal.     Palpations: Abdomen is soft.  Musculoskeletal:     Right lower leg: No edema.     Left lower leg: No edema.     Laboratory examination:   Recent  Labs    10/14/22 1135  NA 138  K 4.3  CL 105  CO2 22  GLUCOSE 93  BUN 16  CREATININE 0.60  CALCIUM 9.2  GFRNONAA >60    Lab Results  Component Value Date   GLUCOSE 93 10/14/2022   NA 138 10/14/2022   K 4.3 10/14/2022   CL 105 10/14/2022   CO2 22 10/14/2022   BUN 16 10/14/2022   CREATININE 0.60 10/14/2022   GFRNONAA >60 10/14/2022   CALCIUM 9.2 10/14/2022   PROT 7.3 10/14/2022   ALBUMIN 4.2 10/14/2022   BILITOT 0.9 10/14/2022   ALKPHOS 71 10/14/2022   AST 14 (L) 10/14/2022   ALT 16 10/14/2022   ANIONGAP 11 10/14/2022      Lab Results  Component Value Date   ALT 16 10/14/2022   AST 14 (L) 10/14/2022   ALKPHOS 71 10/14/2022   BILITOT 0.9 10/14/2022       Latest Ref Rng & Units 10/14/2022  11:35 AM 02/19/2014    6:23 PM  Hepatic Function  Total Protein 6.5 - 8.1 g/dL 7.3  7.2   Albumin 3.5 - 5.0 g/dL 4.2  4.4   AST 15 - 41 U/L 14  14   ALT 0 - 44 U/L 16  11   Alk Phosphatase 38 - 126 U/L 71  68   Total Bilirubin 0.3 - 1.2 mg/dL 0.9  0.4    No results found for: "TSH"   External labs:   Labs 09/17/2022:  Hb 12.9/HCT 39.6, platelets 258.  Serum glucose 95 mg, BUN 10, creatinine 0.73, EGFR 85 mill, LFTs normal.  A1c 5.5%.  Cholesterol, total 274.000 m 11/22/2019 HDL 85.000 mg 11/22/2019 LDL 176.000 m 11/22/2019 Triglycerides 80.000 mg 11/22/2019  A1C 6.000 % 11/22/2019 TSH 0.850 11/22/2019  Radiology:    Cardiac Studies:   ***  EKG:   PCP EKG 09/17/2022: Normal sinus rhythm at a rate of 82 bpm, normal EKG.  Medications and allergies  No Known Allergies   Medication list   Current Outpatient Medications:    amLODipine (NORVASC) 5 MG tablet, Take 1 tablet (5 mg total) by mouth daily., Disp: 30 tablet, Rfl: 2  Assessment     ICD-10-CM   1. Pre-operative cardiovascular examination  Z01.810 amLODipine (NORVASC) 5 MG tablet    2. Aortic ejection murmur  I35.1 PCV ECHOCARDIOGRAM COMPLETE    3. Elevated BP without diagnosis of hypertension Due to  NSAID and Pain  R03.0 amLODipine (NORVASC) 5 MG tablet       Orders Placed This Encounter  Procedures   PCV ECHOCARDIOGRAM COMPLETE    Standing Status:   Future    Standing Expiration Date:   10/15/2023    Meds ordered this encounter  Medications   amLODipine (NORVASC) 5 MG tablet    Sig: Take 1 tablet (5 mg total) by mouth daily.    Dispense:  30 tablet    Refill:  2    Medications Discontinued During This Encounter  Medication Reason   hydrOXYzine (ATARAX/VISTARIL) 25 MG tablet    naproxen sodium (ANAPROX) 220 MG tablet    traMADol (ULTRAM) 50 MG tablet      Recommendations:   Amber Cooley is a 77 y.o.  ***    Yates Decamp, MD, [facility] 10/15/2022, 11:22 AM Office: [phone]

## 2022-10-19 NOTE — Progress Notes (Signed)
Anesthesia Chart Review   Case: [id number] Date/Time: 10/27/22 0930   Procedure: TOTAL HIP ARTHROPLASTY (Right: Hip)   Anesthesia type: Choice   Pre-op diagnosis: OA RIGHT HIP   Location: [facility] ROOM 06 / [facility]   Surgeons: Joen Laura, MD       DISCUSSION:77 y.o. never smoker with h/o right hip OA scheduled for above procedure 10/27/2022 with Dr. Weber Cooks.   Pt referred to cardiology for preoperative evaluation of systolic murmur.  Seen by Dr. Jacinto Halim on 10/15/2022. Per OV note, "In view of elevated blood pressure clearly related to NSAID use, upcoming surgery, I have started her on amlodipine 5 mg daily so her surgery will not be postponed. I also sent a note to the surgeon to increase amlodipine to 10 mg if blood pressure is not controlled. She has aortic sclerotic murmur, do not suspect severe aortic stenosis or any other significant valvular abnormality. However will obtain echocardiogram, this does not have to be done prior to her surgery which is scheduled in 2 days."  Echo has been ordered, scheduled 11/10/2022 which is after surgery.  Discussed with Dr. Aleene Davidson.  Ok to proceed based on Dr. Verl Dicker recommendations.   VS: BP 139/75 Comment: left arm sitting  Pulse 86   Temp 36.8 C (Oral)   Resp 16   Ht 5\' 3"  (1.6 m)   Wt 57.6 kg   SpO2 97%   BMI 22.50 kg/m   PROVIDERS: Irena Reichmann, DO is PCP   Yates Decamp, MD is Cardiologist  LABS: Labs reviewed: Acceptable for surgery. (all labs ordered are listed, but only abnormal results are displayed)  Labs Reviewed  COMPREHENSIVE METABOLIC PANEL - Abnormal; Notable for the following components:      Result Value   AST 14 (*)    All other components within normal limits  SURGICAL PCR SCREEN  CBC WITH DIFFERENTIAL/PLATELET  TYPE AND SCREEN     IMAGES:   EKG:   CV:  Past Medical History:  Diagnosis Date   Arthritis    Heart murmur    heard at preop with Dr. Blanchie Dessert    Past Surgical History:   Procedure Laterality Date   EYE SURGERY Bilateral 2020   cataract w/ IOL   NO PAST SURGERIES     TONSILLECTOMY     TUBAL LIGATION      MEDICATIONS:  amLODipine (NORVASC) 5 MG tablet   No current facility-administered medications for this encounter.    Jodell Cipro Ward, PA-C [facility]-Surgical Testing [phone]

## 2022-10-19 NOTE — Anesthesia Preprocedure Evaluation (Addendum)
Anesthesia Evaluation  Patient identified by MRN, date of birth, ID band Patient awake    Reviewed: Allergy & Precautions, NPO status , Patient's Chart, lab work & pertinent test results  Airway Mallampati: I       Dental no notable dental hx.    Pulmonary neg pulmonary ROS   Pulmonary exam normal        Cardiovascular hypertension, Pt. on medications Normal cardiovascular exam     Neuro/Psych negative neurological ROS  negative psych ROS   GI/Hepatic Neg liver ROS,GERD  Medicated,,  Endo/Other  negative endocrine ROS    Renal/GU negative Renal ROS  negative genitourinary   Musculoskeletal   Abdominal Normal abdominal exam  (+)   Peds  Hematology negative hematology ROS (+)   Anesthesia Other Findings   Reproductive/Obstetrics                             Anesthesia Physical Anesthesia Plan  ASA: 2  Anesthesia Plan: Spinal   Post-op Pain Management:    Induction:   PONV Risk Score and Plan: 2 and Ondansetron  Airway Management Planned: Natural Airway, Simple Face Mask and Nasal Cannula  Additional Equipment: None  Intra-op Plan:   Post-operative Plan:   Informed Consent: I have reviewed the patients History and Physical, chart, labs and discussed the procedure including the risks, benefits and alternatives for the proposed anesthesia with the patient or authorized representative who has indicated his/her understanding and acceptance.       Plan Discussed with:   Anesthesia Plan Comments: (See PAT note 10/14/2022)       Anesthesia Quick Evaluation

## 2022-10-27 ENCOUNTER — Encounter (HOSPITAL_COMMUNITY)
Admission: RE | Disposition: A | Discharge status: Home / Self Care | Payer: Self-pay | Source: Ambulatory Visit | Attending: Orthopedic Surgery

## 2022-10-27 ENCOUNTER — Ambulatory Visit (HOSPITAL_COMMUNITY): Payer: Medicare (Managed Care) | Admitting: Physician Assistant

## 2022-10-27 ENCOUNTER — Ambulatory Visit (HOSPITAL_COMMUNITY): Payer: Medicare (Managed Care)

## 2022-10-27 ENCOUNTER — Ambulatory Visit (HOSPITAL_COMMUNITY)
Admission: RE | Admit: 2022-10-27 | Discharge: 2022-10-27 | Disposition: A | Discharge status: Home / Self Care | Payer: Medicare (Managed Care) | Source: Ambulatory Visit | Attending: Orthopedic Surgery | Admitting: Orthopedic Surgery

## 2022-10-27 ENCOUNTER — Ambulatory Visit (HOSPITAL_BASED_OUTPATIENT_CLINIC_OR_DEPARTMENT_OTHER): Payer: Medicare (Managed Care) | Admitting: Anesthesiology

## 2022-10-27 ENCOUNTER — Other Ambulatory Visit: Payer: Self-pay

## 2022-10-27 ENCOUNTER — Encounter (HOSPITAL_COMMUNITY): Payer: Self-pay | Admitting: Orthopedic Surgery

## 2022-10-27 DIAGNOSIS — I10 Essential (primary) hypertension: Secondary | ICD-10-CM | POA: Diagnosis not present

## 2022-10-27 DIAGNOSIS — Z79899 Other long term (current) drug therapy: Secondary | ICD-10-CM | POA: Insufficient documentation

## 2022-10-27 DIAGNOSIS — K219 Gastro-esophageal reflux disease without esophagitis: Secondary | ICD-10-CM | POA: Diagnosis not present

## 2022-10-27 DIAGNOSIS — M1611 Unilateral primary osteoarthritis, right hip: Secondary | ICD-10-CM | POA: Diagnosis present

## 2022-10-27 HISTORY — PX: TOTAL HIP ARTHROPLASTY: SHX124

## 2022-10-27 LAB — ABO/RH: ABO/RH(D): A POS

## 2022-10-27 SURGERY — ARTHROPLASTY, HIP, TOTAL,POSTERIOR APPROACH
Anesthesia: Spinal | Site: Hip | Laterality: Right

## 2022-10-27 MED ORDER — DEXAMETHASONE SODIUM PHOSPHATE 10 MG/ML IJ SOLN
INTRAMUSCULAR | Status: AC
  Filled 2022-10-27: qty 1

## 2022-10-27 MED ORDER — PROPOFOL 10 MG/ML IV BOLUS
INTRAVENOUS | Status: DC | PRN
  Administered 2022-10-27: 30 mg via INTRAVENOUS
  Administered 2022-10-27: 20 mg via INTRAVENOUS

## 2022-10-27 MED ORDER — CEFAZOLIN SODIUM-DEXTROSE 2-4 GM/100ML-% IV SOLN
2.0000 g | Freq: Four times a day (QID) | INTRAVENOUS | Status: DC
  Administered 2022-10-27: 2 g via INTRAVENOUS

## 2022-10-27 MED ORDER — FENTANYL CITRATE PF 50 MCG/ML IJ SOSY
25.0000 ug | PREFILLED_SYRINGE | INTRAMUSCULAR | Status: DC | PRN
  Administered 2022-10-27: 25 ug via INTRAVENOUS

## 2022-10-27 MED ORDER — PROPOFOL 500 MG/50ML IV EMUL
INTRAVENOUS | Status: DC | PRN
  Administered 2022-10-27: 50 ug/kg/min via INTRAVENOUS

## 2022-10-27 MED ORDER — OXYCODONE HCL 5 MG PO TABS: 5.0000 mg | ORAL_TABLET | ORAL | Status: DC | PRN

## 2022-10-27 MED ORDER — OXYCODONE HCL 5 MG PO TABS: 5.0000 mg | ORAL_TABLET | Freq: Once | ORAL | Status: AC | PRN

## 2022-10-27 MED ORDER — CEFAZOLIN SODIUM-DEXTROSE 2-4 GM/100ML-% IV SOLN
2.0000 g | INTRAVENOUS | Status: AC
  Administered 2022-10-27: 2 g via INTRAVENOUS
  Filled 2022-10-27: qty 100

## 2022-10-27 MED ORDER — SODIUM CHLORIDE 0.9 % IV SOLN: INTRAVENOUS | Status: DC

## 2022-10-27 MED ORDER — ACETAMINOPHEN 160 MG/5ML PO SOLN: 325.0000 mg | ORAL | Status: DC | PRN

## 2022-10-27 MED ORDER — EPHEDRINE SULFATE-NACL 50-0.9 MG/10ML-% IV SOSY
PREFILLED_SYRINGE | INTRAVENOUS | Status: DC | PRN
  Administered 2022-10-27: 10 mg via INTRAVENOUS

## 2022-10-27 MED ORDER — PHENYLEPHRINE HCL-NACL 20-0.9 MG/250ML-% IV SOLN
INTRAVENOUS | Status: DC | PRN
  Administered 2022-10-27: 50 ug/min via INTRAVENOUS

## 2022-10-27 MED ORDER — SODIUM CHLORIDE (PF) 0.9 % IJ SOLN
INTRAMUSCULAR | Status: AC
  Filled 2022-10-27: qty 30

## 2022-10-27 MED ORDER — PROPOFOL 10 MG/ML IV BOLUS
INTRAVENOUS | Status: AC
  Filled 2022-10-27: qty 20

## 2022-10-27 MED ORDER — METHOCARBAMOL 500 MG IVPB - SIMPLE MED: 500.0000 mg | Freq: Four times a day (QID) | INTRAVENOUS | Status: DC | PRN

## 2022-10-27 MED ORDER — POVIDONE-IODINE 10 % EX SWAB: 2.0000 | Freq: Once | CUTANEOUS | Status: DC

## 2022-10-27 MED ORDER — FENTANYL CITRATE PF 50 MCG/ML IJ SOSY
PREFILLED_SYRINGE | INTRAMUSCULAR | Status: AC
  Administered 2022-10-27: 25 ug via INTRAVENOUS
  Filled 2022-10-27: qty 1

## 2022-10-27 MED ORDER — BUPIVACAINE-EPINEPHRINE 0.25% -1:200000 IJ SOLN
INTRAMUSCULAR | Status: DC | PRN
  Administered 2022-10-27: 30 mL

## 2022-10-27 MED ORDER — 0.9 % SODIUM CHLORIDE (POUR BTL) OPTIME
TOPICAL | Status: DC | PRN
  Administered 2022-10-27: 1000 mL

## 2022-10-27 MED ORDER — ASPIRIN 81 MG PO TBEC: 81.0000 mg | DELAYED_RELEASE_TABLET | Freq: Two times a day (BID) | ORAL | 0 refills | Status: AC

## 2022-10-27 MED ORDER — GLYCOPYRROLATE 0.2 MG/ML IJ SOLN
INTRAMUSCULAR | Status: DC | PRN
  Administered 2022-10-27: .2 mg via INTRAVENOUS

## 2022-10-27 MED ORDER — METHOCARBAMOL 500 MG PO TABS: 500.0000 mg | ORAL_TABLET | Freq: Three times a day (TID) | ORAL | 0 refills | Status: AC | PRN

## 2022-10-27 MED ORDER — OXYCODONE HCL 5 MG PO TABS
ORAL_TABLET | ORAL | Status: AC
  Administered 2022-10-27: 5 mg via ORAL
  Filled 2022-10-27: qty 1

## 2022-10-27 MED ORDER — METHOCARBAMOL 500 MG PO TABS: 500.0000 mg | ORAL_TABLET | Freq: Four times a day (QID) | ORAL | Status: DC | PRN

## 2022-10-27 MED ORDER — LACTATED RINGERS IV BOLUS
500.0000 mL | Freq: Once | INTRAVENOUS | Status: AC
  Administered 2022-10-27: 500 mL via INTRAVENOUS

## 2022-10-27 MED ORDER — FENTANYL CITRATE (PF) 100 MCG/2ML IJ SOLN
INTRAMUSCULAR | Status: DC | PRN
  Administered 2022-10-27: 50 ug via INTRAVENOUS

## 2022-10-27 MED ORDER — ORAL CARE MOUTH RINSE: 15.0000 mL | Freq: Once | OROMUCOSAL | Status: AC

## 2022-10-27 MED ORDER — PROPOFOL 1000 MG/100ML IV EMUL
INTRAVENOUS | Status: AC
  Filled 2022-10-27: qty 100

## 2022-10-27 MED ORDER — ONDANSETRON HCL 4 MG/2ML IJ SOLN
INTRAMUSCULAR | Status: DC | PRN
  Administered 2022-10-27: 4 mg via INTRAVENOUS

## 2022-10-27 MED ORDER — BUPIVACAINE LIPOSOME 1.3 % IJ SUSP: 10.0000 mL | Freq: Once | INTRAMUSCULAR | Status: DC

## 2022-10-27 MED ORDER — TRANEXAMIC ACID-NACL 1000-0.7 MG/100ML-% IV SOLN
1000.0000 mg | INTRAVENOUS | Status: AC
  Administered 2022-10-27: 1000 mg via INTRAVENOUS
  Filled 2022-10-27: qty 100

## 2022-10-27 MED ORDER — ONDANSETRON HCL 4 MG PO TABS
4.0000 mg | ORAL_TABLET | Freq: Four times a day (QID) | ORAL | Status: DC | PRN
  Filled 2022-10-27: qty 1

## 2022-10-27 MED ORDER — LIDOCAINE HCL (PF) 2 % IJ SOLN
INTRAMUSCULAR | Status: AC
  Filled 2022-10-27: qty 5

## 2022-10-27 MED ORDER — SODIUM CHLORIDE 0.9 % IR SOLN
Status: DC | PRN
  Administered 2022-10-27: 1000 mL

## 2022-10-27 MED ORDER — ACETAMINOPHEN 500 MG PO TABS
ORAL_TABLET | ORAL | Status: AC
  Filled 2022-10-27: qty 2

## 2022-10-27 MED ORDER — ACETAMINOPHEN 500 MG PO TABS: 1000.0000 mg | ORAL_TABLET | Freq: Three times a day (TID) | ORAL | 0 refills | Status: AC | PRN

## 2022-10-27 MED ORDER — POLYETHYLENE GLYCOL 3350 17 G PO PACK: 17.0000 g | PACK | Freq: Every day | ORAL | 0 refills | Status: AC

## 2022-10-27 MED ORDER — ONDANSETRON HCL 4 MG/2ML IJ SOLN
INTRAMUSCULAR | Status: AC
  Filled 2022-10-27: qty 2

## 2022-10-27 MED ORDER — ISOPROPYL ALCOHOL 70 % SOLN
Status: DC | PRN
  Administered 2022-10-27: 1 via TOPICAL

## 2022-10-27 MED ORDER — ONDANSETRON HCL 4 MG/2ML IJ SOLN: 4.0000 mg | Freq: Once | INTRAMUSCULAR | Status: DC | PRN

## 2022-10-27 MED ORDER — LACTATED RINGERS IV BOLUS
250.0000 mL | Freq: Once | INTRAVENOUS | Status: AC
  Administered 2022-10-27: 250 mL via INTRAVENOUS

## 2022-10-27 MED ORDER — OXYCODONE HCL 5 MG PO TABS: 5.0000 mg | ORAL_TABLET | ORAL | 0 refills | Status: AC | PRN

## 2022-10-27 MED ORDER — OMEPRAZOLE 40 MG PO CPDR: 40.0000 mg | DELAYED_RELEASE_CAPSULE | Freq: Every day | ORAL | 0 refills | Status: AC

## 2022-10-27 MED ORDER — CHLORHEXIDINE GLUCONATE 0.12 % MT SOLN
15.0000 mL | Freq: Once | OROMUCOSAL | Status: AC
  Administered 2022-10-27: 15 mL via OROMUCOSAL

## 2022-10-27 MED ORDER — CEFAZOLIN SODIUM-DEXTROSE 2-4 GM/100ML-% IV SOLN
INTRAVENOUS | Status: AC
  Filled 2022-10-27: qty 100

## 2022-10-27 MED ORDER — BUPIVACAINE IN DEXTROSE 0.75-8.25 % IT SOLN
INTRATHECAL | Status: DC | PRN
  Administered 2022-10-27: 1.6 mL via INTRATHECAL

## 2022-10-27 MED ORDER — DEXAMETHASONE SODIUM PHOSPHATE 10 MG/ML IJ SOLN: 8.0000 mg | Freq: Once | INTRAMUSCULAR | Status: DC

## 2022-10-27 MED ORDER — ACETAMINOPHEN 500 MG PO TABS
1000.0000 mg | ORAL_TABLET | Freq: Four times a day (QID) | ORAL | Status: DC
  Administered 2022-10-27: 1000 mg via ORAL

## 2022-10-27 MED ORDER — ONDANSETRON HCL 4 MG/2ML IJ SOLN: 4.0000 mg | Freq: Four times a day (QID) | INTRAMUSCULAR | Status: DC | PRN

## 2022-10-27 MED ORDER — ISOPROPYL ALCOHOL 70 % SOLN
Status: AC
  Filled 2022-10-27: qty 480

## 2022-10-27 MED ORDER — BUPIVACAINE LIPOSOME 1.3 % IJ SUSP
INTRAMUSCULAR | Status: DC | PRN
  Administered 2022-10-27: 20 mL

## 2022-10-27 MED ORDER — PHENYLEPHRINE 80 MCG/ML (10ML) SYRINGE FOR IV PUSH (FOR BLOOD PRESSURE SUPPORT)
PREFILLED_SYRINGE | INTRAVENOUS | Status: DC | PRN
  Administered 2022-10-27 (×2): 80 ug via INTRAVENOUS

## 2022-10-27 MED ORDER — ACETAMINOPHEN 500 MG PO TABS
1000.0000 mg | ORAL_TABLET | Freq: Once | ORAL | Status: AC
  Administered 2022-10-27: 1000 mg via ORAL
  Filled 2022-10-27: qty 2

## 2022-10-27 MED ORDER — HYDROMORPHONE HCL 1 MG/ML IJ SOLN: 0.5000 mg | INTRAMUSCULAR | Status: DC | PRN

## 2022-10-27 MED ORDER — KETOROLAC TROMETHAMINE 15 MG/ML IJ SOLN
7.5000 mg | Freq: Four times a day (QID) | INTRAMUSCULAR | Status: DC
  Administered 2022-10-27: 7.5 mg via INTRAVENOUS

## 2022-10-27 MED ORDER — GLYCOPYRROLATE 0.2 MG/ML IJ SOLN
INTRAMUSCULAR | Status: AC
  Filled 2022-10-27: qty 1

## 2022-10-27 MED ORDER — ONDANSETRON HCL 4 MG PO TABS: 4.0000 mg | ORAL_TABLET | Freq: Three times a day (TID) | ORAL | 0 refills | Status: AC | PRN

## 2022-10-27 MED ORDER — DEXAMETHASONE SODIUM PHOSPHATE 10 MG/ML IJ SOLN
INTRAMUSCULAR | Status: DC | PRN
  Administered 2022-10-27: 6 mg via INTRAVENOUS

## 2022-10-27 MED ORDER — WATER FOR IRRIGATION, STERILE IR SOLN
Status: DC | PRN
  Administered 2022-10-27: 2000 mL

## 2022-10-27 MED ORDER — LACTATED RINGERS IV SOLN: INTRAVENOUS | Status: DC

## 2022-10-27 MED ORDER — BUPIVACAINE HCL 0.25 % IJ SOLN
INTRAMUSCULAR | Status: AC
  Filled 2022-10-27: qty 1

## 2022-10-27 MED ORDER — FENTANYL CITRATE (PF) 100 MCG/2ML IJ SOLN
INTRAMUSCULAR | Status: AC
  Filled 2022-10-27: qty 2

## 2022-10-27 MED ORDER — OXYCODONE HCL 5 MG/5ML PO SOLN: 5.0000 mg | Freq: Once | ORAL | Status: AC | PRN

## 2022-10-27 MED ORDER — ACETAMINOPHEN 325 MG PO TABS: 325.0000 mg | ORAL_TABLET | Freq: Four times a day (QID) | ORAL | Status: DC | PRN

## 2022-10-27 MED ORDER — METHOCARBAMOL 500 MG IVPB - SIMPLE MED
INTRAVENOUS | Status: AC
  Administered 2022-10-27: 500 mg via INTRAVENOUS
  Filled 2022-10-27: qty 55

## 2022-10-27 MED ORDER — KETOROLAC TROMETHAMINE 15 MG/ML IJ SOLN
INTRAMUSCULAR | Status: AC
  Filled 2022-10-27: qty 1

## 2022-10-27 MED ORDER — ACETAMINOPHEN 325 MG PO TABS: 325.0000 mg | ORAL_TABLET | ORAL | Status: DC | PRN

## 2022-10-27 MED ORDER — EPINEPHRINE PF 1 MG/ML IJ SOLN
INTRAMUSCULAR | Status: AC
  Filled 2022-10-27: qty 1

## 2022-10-27 MED ORDER — SODIUM CHLORIDE (PF) 0.9 % IJ SOLN
INTRAMUSCULAR | Status: DC | PRN
  Administered 2022-10-27: 30 mL

## 2022-10-27 MED ORDER — CELECOXIB 100 MG PO CAPS: 100.0000 mg | ORAL_CAPSULE | Freq: Two times a day (BID) | ORAL | 0 refills | Status: AC

## 2022-10-27 MED ORDER — BUPIVACAINE LIPOSOME 1.3 % IJ SUSP
INTRAMUSCULAR | Status: AC
  Filled 2022-10-27: qty 20

## 2022-10-27 SURGICAL SUPPLY — 71 items
ADH SKN CLS APL DERMABOND .7 (GAUZE/BANDAGES/DRESSINGS) ×2
APL PRP STRL LF DISP 70% ISPRP (MISCELLANEOUS) ×2
BAG COUNTER SPONGE SURGICOUNT (BAG) IMPLANT
BAG SPEC THK2 15X12 ZIP CLS (MISCELLANEOUS) ×1
BAG SPNG CNTER NS LX DISP (BAG) ×1
BAG ZIPLOCK 12X15 (MISCELLANEOUS) ×1 IMPLANT
BLADE SAW SAG 25X90X1.19 (BLADE) ×1 IMPLANT
CHLORAPREP W/TINT 26 (MISCELLANEOUS) ×2 IMPLANT
COVER SURGICAL LIGHT HANDLE (MISCELLANEOUS) ×1 IMPLANT
DERMABOND ADVANCED .7 DNX12 (GAUZE/BANDAGES/DRESSINGS) ×1 IMPLANT
DRAPE HIP W/POCKET STRL (MISCELLANEOUS) ×1 IMPLANT
DRAPE INCISE IOBAN 66X45 STRL (DRAPES) ×1 IMPLANT
DRAPE INCISE IOBAN 85X60 (DRAPES) ×1 IMPLANT
DRAPE POUCH INSTRU U-SHP 10X18 (DRAPES) ×1 IMPLANT
DRAPE SHEET LG 3/4 BI-LAMINATE (DRAPES) ×3 IMPLANT
DRAPE SURG 17X11 SM STRL (DRAPES) ×1 IMPLANT
DRAPE U-SHAPE 47X51 STRL (DRAPES) ×2 IMPLANT
DRESSING AQUACEL AG SP 3.5X10 (GAUZE/BANDAGES/DRESSINGS) ×1 IMPLANT
DRSG AQUACEL AG ADV 3.5X10 (GAUZE/BANDAGES/DRESSINGS) IMPLANT
DRSG AQUACEL AG SP 3.5X10 (GAUZE/BANDAGES/DRESSINGS) ×1
ELECT BLADE TIP CTD 4 INCH (ELECTRODE) ×1 IMPLANT
ELECT REM PT RETURN 15FT ADLT (MISCELLANEOUS) ×1 IMPLANT
GLOVE BIO SURGEON STRL SZ 6.5 (GLOVE) ×2 IMPLANT
GLOVE BIOGEL PI IND STRL 6.5 (GLOVE) ×1 IMPLANT
GLOVE BIOGEL PI IND STRL 8 (GLOVE) ×1 IMPLANT
GLOVE SURG ORTHO 8.0 STRL STRW (GLOVE) ×2 IMPLANT
GOWN STRL REUS W/ TWL XL LVL3 (GOWN DISPOSABLE) ×2 IMPLANT
GOWN STRL REUS W/TWL XL LVL3 (GOWN DISPOSABLE) ×2
HANDPIECE INTERPULSE COAX TIP (DISPOSABLE)
HEAD BIOLOX HIP 36/-2.5 (Joint) IMPLANT
HIP BIOLOX HD 36/-2.5 (Joint) ×1 IMPLANT
HOLDER FOLEY CATH W/STRAP (MISCELLANEOUS) ×1 IMPLANT
HOOD PEEL AWAY T7 (MISCELLANEOUS) ×3 IMPLANT
INSERT 0 DEGREE 36 (Miscellaneous) IMPLANT
INSERT TRIDENT POLY 36 0DEG (Insert) IMPLANT
KIT BASIN OR (CUSTOM PROCEDURE TRAY) ×1 IMPLANT
KIT TURNOVER KIT A (KITS) IMPLANT
MANIFOLD NEPTUNE II (INSTRUMENTS) ×1 IMPLANT
MARKER SKIN DUAL TIP RULER LAB (MISCELLANEOUS) ×1 IMPLANT
NDL HYPO 22X1.5 SAFETY MO (MISCELLANEOUS) IMPLANT
NEEDLE HYPO 22X1.5 SAFETY MO (MISCELLANEOUS) IMPLANT
NS IRRIG 1000ML POUR BTL (IV SOLUTION) ×1 IMPLANT
PACK TOTAL JOINT (CUSTOM PROCEDURE TRAY) ×1 IMPLANT
PRESSURIZER FEMORAL UNIV (MISCELLANEOUS) IMPLANT
PROTECTOR NERVE ULNAR (MISCELLANEOUS) ×1 IMPLANT
RETRIEVER SUT HEWSON (MISCELLANEOUS) ×1 IMPLANT
SCREW HEX LP 6.5X15 (Screw) IMPLANT
SCREW HEX LP 6.5X25 (Screw) IMPLANT
SEALER BIPOLAR AQUA 6.0 (INSTRUMENTS) IMPLANT
SET HNDPC FAN SPRY TIP SCT (DISPOSABLE) IMPLANT
SHELL CLUSTERHOLE ACETABULAR 5 (Shell) IMPLANT
SOLUTION IRRIG SURGIPHOR (IV SOLUTION) IMPLANT
SPIKE FLUID TRANSFER (MISCELLANEOUS) ×3 IMPLANT
STEM HIP 4 127DEG (Stem) IMPLANT
SUCTION TUBE FRAZIER 12FR DISP (SUCTIONS) ×1 IMPLANT
SUT BONE WAX W31G (SUTURE) ×1 IMPLANT
SUT ETHIBOND #5 BRAIDED 30INL (SUTURE) ×1 IMPLANT
SUT MNCRL AB 3-0 PS2 18 (SUTURE) ×1 IMPLANT
SUT STRATAFIX 0 PDS 27 VIOLET (SUTURE) ×1
SUT STRATAFIX PDO 1 14 VIOLET (SUTURE) ×1
SUT STRATFX PDO 1 14 VIOLET (SUTURE) ×1
SUT VIC AB 2-0 CT2 27 (SUTURE) ×2 IMPLANT
SUTURE STRATFX 0 PDS 27 VIOLET (SUTURE) ×1 IMPLANT
SUTURE STRATFX PDO 1 14 VIOLET (SUTURE) ×1 IMPLANT
SYR 20ML LL LF (SYRINGE) ×2 IMPLANT
TOWEL OR 17X26 10 PK STRL BLUE (TOWEL DISPOSABLE) ×1 IMPLANT
TOWER CARTRIDGE SMART MIX (DISPOSABLE) IMPLANT
TRAY FOLEY MTR SLVR 16FR STAT (SET/KITS/TRAYS/PACK) IMPLANT
TUBE SUCTION HIGH CAP CLEAR NV (SUCTIONS) ×1 IMPLANT
UNDERPAD 30X36 HEAVY ABSORB (UNDERPADS AND DIAPERS) ×1 IMPLANT
WATER STERILE IRR 1000ML POUR (IV SOLUTION) ×2 IMPLANT

## 2022-10-27 NOTE — Interval H&P Note (Signed)
The patient has been re-examined, and the chart reviewed, and there have been no interval changes to the documented history and physical.    Plan for R THA for R hip OA  The operative side was examined and the patient was confirmed to have sensation to DPN, SPN, TN intact, Motor EHL, ext, flex 5/5, and DP 2+, PT 2+, No significant edema.   The risks, benefits, and alternatives have been discussed at length with patient, and the patient is willing to proceed.  Right hip marked. Consent has been signed.  

## 2022-10-27 NOTE — Anesthesia Postprocedure Evaluation (Signed)
Anesthesia Post Note  Patient: Amber Cooley  Procedure(s) Performed: TOTAL HIP ARTHROPLASTY (Right: Hip)     Anesthesia Type: Spinal Level of consciousness: awake Pain management: pain level controlled Vital Signs Assessment: post-procedure vital signs reviewed and stable Respiratory status: spontaneous breathing Cardiovascular status: stable Postop Assessment: no headache, no backache, spinal receding and no apparent nausea or vomiting Anesthetic complications: no  No notable events documented.  Last Vitals:  Vitals:   10/27/22 1230 10/27/22 1245  BP: 114/82 (!) 118/58  Pulse: 77 81  Resp: 13 16  Temp:    SpO2: 100% 100%    Last Pain:  Vitals:   10/27/22 1245  TempSrc:   PainSc: 0-No pain                 Caren Macadam

## 2022-10-27 NOTE — Discharge Instructions (Signed)

## 2022-10-27 NOTE — Anesthesia Procedure Notes (Signed)
Spinal  Patient location during procedure: OR Start time: 10/27/2022 10:10 AM End time: 10/27/2022 10:13 AM Reason for block: surgical anesthesia Staffing Performed: anesthesiologist  Anesthesiologist: Leilani Able, MD Performed by: Leilani Able, MD Authorized by: Leilani Able, MD   Preanesthetic Checklist Completed: patient identified, IV checked, site marked, risks and benefits discussed, surgical consent, monitors and equipment checked, pre-op evaluation and timeout performed Spinal Block Patient position: sitting Prep: DuraPrep and site prepped and draped Patient monitoring: continuous pulse ox and blood pressure Approach: midline Location: L3-4 Injection technique: single-shot Needle Needle type: Pencan  Needle gauge: 24 G Needle length: 10 cm Needle insertion depth: 5 cm Assessment Sensory level: T6 Events: CSF return

## 2022-10-27 NOTE — Transfer of Care (Signed)
Immediate Anesthesia Transfer of Care Note  Patient: Amber Cooley  Procedure(s) Performed: TOTAL HIP ARTHROPLASTY (Right: Hip)  Patient Location: PACU  Anesthesia Type:Spinal  Level of Consciousness: awake, alert , and oriented  Airway & Oxygen Therapy: Patient Spontanous Breathing and Patient connected to face mask oxygen  Post-op Assessment: Report given to RN and Post -op Vital signs reviewed and stable  Post vital signs: Reviewed and stable  Last Vitals:  Vitals Value Taken Time  BP 91/58 10/27/22 1207  Temp    Pulse 72 10/27/22 1209  Resp 18 10/27/22 1209  SpO2 100 % 10/27/22 1209  Vitals shown include unvalidated device data.  Last Pain:  Vitals:   10/27/22 0821  TempSrc:   PainSc: 0-No pain         Complications: No notable events documented.

## 2022-10-27 NOTE — Progress Notes (Signed)
Physical Therapy Treatment Patient Details Name: Amber Cooley MRN: [id number] DOB: [birth year] Today's Date: 10/27/2022   History of Present Illness 77 yo female presents to therapy s/p R THA, posterior lateral approach on 10/27/2022 due to failure of conservative measures. Pt is currently R LE WBAT and no formal hip precautions. Pt PMH includes but is not limited to: ca, heart murmur and arthritis.    PT Comments      Elmer Alessi is a 77 y.o. female POD 0 s/p R THA. Patient reports IND with mobility at baseline. PT returned to reassess pt readiness for same day d/c ~ 1 hr and 10 mins later. Positive findings for orthostatic hypotension (see below) and pt is asymptomatic. Patient is now limited by functional impairments (see PT problem list below) and requires min guard and cues for transfers and gait with RW. Patient was able to ambulate 50 feet with RW and min guard progressing to close S and cues for safe walker management. No reports of dizziness or increased R LE pain with gait tasks, pt provided with pain medication between sessions. PPatient instructed in exercises to facilitate ROM and circulation reviewed and HO provided. Patient will benefit from continued skilled PT interventions to address impairments and progress towards PLOF. Patient has met mobility goals for safe discharge home with family support and OPPT; will continue to follow if pt continues acute stay to progress towards Mod I goals.   Bp seated at rest 119/62 Bp immediate standing 103/68 Bp s/p 3 min standing 118/71   Recommendations for follow up therapy are one component of a multi-disciplinary discharge planning process, led by the attending physician.  Recommendations may be updated based on patient status, additional functional criteria and insurance authorization.  Follow Up Recommendations       Assistance Recommended at Discharge Intermittent Supervision/Assistance  Patient can return home with the following A  little help with walking and/or transfers;A little help with bathing/dressing/bathroom;Assistance with cooking/housework;Assist for transportation;Help with stairs or ramp for entrance   Equipment Recommendations  Rolling walker (2 wheels) (provided and adjusted at time of eval)    Recommendations for Other Services       Precautions / Restrictions Precautions Precautions: Fall Restrictions Weight Bearing Restrictions: No     Mobility  Bed Mobility Overal bed mobility: Needs Assistance Bed Mobility: Supine to Sit     Supine to sit: Min guard     General bed mobility comments: pt seated in recliner when PT returned    Transfers Overall transfer level: Needs assistance Equipment used: Rolling walker (2 wheels) Transfers: Sit to/from Stand Sit to Stand: Min guard           General transfer comment: cues for proper UE placement    Ambulation/Gait Ambulation/Gait assistance: Min guard Gait Distance (Feet): 50 Feet Assistive device: Rolling walker (2 wheels) Gait Pattern/deviations: Step-to pattern, Antalgic Gait velocity: slightly decreased     General Gait Details: step almost through pattern, no reports of increased pain or dizziness with ambulation   Stairs Stairs: Yes Stairs assistance: Min guard Stair Management:  (L UE on wall for support and R UE HHA with daughter to [place] home setting) Number of Stairs: 2 General stair comments: min cues for proper sequencing, pt elected to descend steps with L LE   Wheelchair Mobility    Modified Rankin (Stroke Patients Only)       Balance Overall balance assessment: Needs assistance Sitting-balance support: Feet supported Sitting balance-Leahy Scale: Good     Standing  balance support: Bilateral upper extremity supported, During functional activity, Reliant on assistive device for balance Standing balance-Leahy Scale: Fair Standing balance comment: static standing no UE support                             Cognition Arousal/Alertness: Awake/alert Behavior During Therapy: WFL for tasks assessed/performed Overall Cognitive Status: Within Functional Limits for tasks assessed                                          Exercises Total Joint Exercises Ankle Circles/Pumps: AROM, Both, 20 reps Quad Sets: AROM, Right, 5 reps Heel Slides: AROM, Right, 5 reps Hip ABduction/ADduction: AROM, Right, 5 reps, Standing Long Arc Quad: AROM, 5 reps, Right Knee Flexion: AROM, 5 reps, Standing, Right Standing Hip Extension: AROM, 5 reps, Right    General Comments        Pertinent Vitals/Pain Pain Assessment Pain Assessment: 0-10 Pain Score: 2  Pain Location: R hip Pain Descriptors / Indicators: Constant, Discomfort, Aching, Operative site guarding Pain Intervention(s): Limited activity within patient's tolerance, Monitored during session, Premedicated before session, Repositioned, Ice applied (pain medication provided between therapy sessions)    Home Living Family/patient expects to be discharged to:: Private residence Living Arrangements: Alone;Children Available Help at Discharge: Family Type of Home: House (in [employer]) Home Access: Stairs to enter Entrance Stairs-Rails: None Entrance Stairs-Number of Steps: 2   Home Layout: One level Home Equipment: Cane - single point      Prior Function            PT Goals (current goals can now be found in the care plan section) Acute Rehab PT Goals Patient Stated Goal: to be able to walk my dog further and multiple times during the day PT Goal Formulation: With patient Time For Goal Achievement: 11/10/22 Potential to Achieve Goals: Good    Frequency    7X/week      PT Plan Current plan remains appropriate    Co-evaluation              AM-PAC PT "6 Clicks" Mobility   Outcome Measure  Help needed turning from your back to your side while in a flat bed without using bedrails?: A Little Help  needed moving from lying on your back to sitting on the side of a flat bed without using bedrails?: A Little Help needed moving to and from a bed to a chair (including a wheelchair)?: A Little Help needed standing up from a chair using your arms (e.g., wheelchair or bedside chair)?: A Little Help needed to walk in hospital room?: A Little Help needed climbing 3-5 steps with a railing? : A Little 6 Click Score: 18    End of Session Equipment Utilized During Treatment: Gait belt Activity Tolerance: Patient tolerated treatment well;No increased pain Patient left: in chair;with call bell/phone within reach;with family/visitor present Nurse Communication: Mobility status;Other (comment) (pt readiness from PT standpoint for d/c home) PT Visit Diagnosis: Unsteadiness on feet (R26.81);Other abnormalities of gait and mobility (R26.89);Muscle weakness (generalized) (M62.81);Pain Pain - Right/Left: Right Pain - part of body: Hip     Time: 1308-6578 PT Time Calculation (min) (ACUTE ONLY): 25 min  Charges:  $Gait Training: 8-22 mins $Therapeutic Exercise: 8-22 mins  Johnny Bridge, PT Acute Rehab    Jacqualyn Posey 10/27/2022, 6:52 PM

## 2022-10-27 NOTE — Op Note (Signed)
10/27/2022  11:40 AM  PATIENT:  Amber Cooley   MRN: [id number]  PRE-OPERATIVE DIAGNOSIS: End-stage right hip osteoarthritis  POST-OPERATIVE DIAGNOSIS:  same  PROCEDURE:  Procedure(s): Right TOTAL HIP ARTHROPLASTY  PREOPERATIVE INDICATIONS:    Amber Cooley is an 77 y.o. female who has a diagnosis of end-stage right hip osteoarthritis and elected for surgical management after failing conservative treatment.  The risks benefits and alternatives were discussed with the patient including but not limited to the risks of nonoperative treatment, versus surgical intervention including infection, bleeding, nerve injury, periprosthetic fracture, the need for revision surgery, dislocation, leg length discrepancy, blood clots, cardiopulmonary complications, morbidity, mortality, among others, and they were willing to proceed.     OPERATIVE REPORT     SURGEON:  Weber Cooks, MD    ASSISTANT: Darron Doom, RNFA, (Present throughout the entire procedure,  necessary for completion of procedure in a timely manner, assisting with retraction, instrumentation, and closure)     ANESTHESIA: Spinal  ESTIMATED BLOOD LOSS: 250cc    COMPLICATIONS:  None.     COMPONENTS:   [employer] Trident 2 TriTanium 52 mm acetabular shell, 6.5 lag screws x 2, neutral polyethylene liner, size 4 Accolade 2 with 127 angle, 36-2.5 mm ceramic head Implant Name Type Inv. Item Serial No. Manufacturer Lot No. LRB No. Used Action  INSERT 0 DEGREE 36 - [id number] Miscellaneous INSERT 0 DEGREE 36  [employer] [id number] Right 1 Implanted  SCREW HEX LP 6.5X25 - [id number] Screw SCREW HEX LP 6.5X25  [employer] HLAA Right 1 Implanted  SCREW HEX LP 6.5X15 - [id number] Screw SCREW HEX LP 6.5X15  [employer] H8C Right 1 Implanted  SHELL CLUSTERHOLE ACETABULAR 5 - [id number] Shell SHELL CLUSTERHOLE ACETABULAR 5  [employer] [id number] A Right 1 Implanted  STEM HIP 4 127DEG - [id number] Stem STEM HIP 4 127DEG   [employer] [id number] A Right 1 Implanted  HIP BIOLOX HD 36/-2.5 - [id number] Joint HIP BIOLOX HD 36/-2.5  [employer] [id number] Right 1 Implanted    The aquamantis was not utilized     PROCEDURE IN DETAIL:   The patient was met in the holding area and  identified.  The appropriate hip was identified and marked at the operative site.  The patient was then transported to the OR  and  placed under anesthesia.  At that point, the patient was  placed in the lateral decubitus position with the operative side up and  secured to the operating room table  and all bony prominences padded. A subaxillary role was also placed.    The operative lower extremity was prepped from the iliac crest to the distal leg.  Sterile draping was performed.  Preoperative antibiotics, 2 gm of ancef,1 gm of Tranexamic Acid, and 8 mg of Decadron administered. Time out was performed prior to incision.      A routine posterolateral approach was utilized via sharp dissection  carried down to the subcutaneous tissue.  Gross bleeders were Bovie coagulated.  The iliotibial band was identified and incised along the length of the skin incision through the glute max fascia.  Charnley retractor was placed with care to protect the sciatic nerve posteriorly.  With the hip internally rotated, the piriformis tendon was identified and released from the femoral insertion and tagged with a #5 Ethibond.  A capsulotomy was then performed off the femoral insertion and also tagged with a #5 Ethibond.    The femoral neck was exposed, and I resected the femoral neck based on preoperative templating relative  to the lesser trochanter.    I then exposed the deep acetabulum, cleared out any tissue including the ligamentum teres.  After adequate visualization, I excised the labrum.  I then started reaming with a 46 mm reamer, first medializing to the floor of the cotyloid fossa, and then in the position of the cup aiming towards the greater  sciatic notch, matching the version of the transverse acetabular ligament and tucked under the anterior wall. I reamed up to 52 mm reamer with good bony bed preparation and a 52 mm cup was chosen.  The real cup was then impacted into place.  Appropriate version and inclination was confirmed clinically matching their bony anatomy, and also with the use of the jig.  I placed 2 screws in the posterior superior quadrant to augment fixation.  A neutral liner was placed and impacted. It was confirmed to be appropriately seated and the acetabular retractors were removed.    I then prepared the proximal femur using the box cutter, Charnley awl, and then sequentially broached starting with 0 up to a size 4.  A trial broach, neck, and head was utilized, and I reduced the hip and it was found to have excellent stability.  There was no impingement with full extension and 90 degrees external rotation.  The hip was stable at the position of sleep and with 90 degrees flexion and 90degrees of internal rotation.  Leg lengths were also clinically assessed in the lateral position and felt to be equal. Intra-Op flatplate was obtained and confirmed appropriate component positions.  Good fill of the femur with the size 4 broach.  And restoration of leg length and offset. No evidence or concern for fracture.  A final femoral prosthesis size 90 was selected. I then impacted the real femoral prosthesis into place.I again trialed and selected a 36 -2.40mm ball. The hip was then reduced and taken through a range of motion. There was no impingement with full extension and 90 degrees external rotation.  The hip was stable at the position of sleep and with 90 degrees flexion and 90degrees of internal rotation. Leg lengths were  again assessed and felt to be restored.  We then opened, and I impacted the real head ball into place.  The posterior capsule was then closed with #5 Ethibond.  The piriformis was repaired through the base of the  abductor tendon using a [place] suture passer.  I then irrigated the hip copiously with dilute Betadine and with normal saline pulse lavage. Periarticular injection was then performed with Exparel.   We repaired the fascia #1 barbed suture, followed by 0 barbed suture for the subcutaneous fat.  Skin was closed with 2-0 Vicryl and 3-0 Monocryl.  Dermabond and Aquacel dressing were applied. The patient was then awakened and returned to PACU in stable and satisfactory condition.  Leg lengths in the supine position were assessed and felt to be clinically equal. There were no complications.  Post op recs: WB: WBAT RLE, No formal hip precautions Abx: ancef Imaging: PACU pelvis Xray Dressing: Aquacell, keep intact until follow up DVT prophylaxis: Aspirin 81BID starting POD1 Follow up: 2 weeks after surgery for a wound check with Dr. Blanchie Dessert at [facility]  Address: [address], [place], [place] [address]  Office Phone: [phone]   Weber Cooks, MD Orthopedic Surgeon

## 2022-10-27 NOTE — Evaluation (Signed)
Physical Therapy Evaluation Patient Details Name: Amber Cooley MRN: [id number] DOB: September 12, 1945 Today's Date: 10/27/2022  History of Present Illness  77 yo female presents to therapy s/p R THA, posterior lateral approach on 10/27/2022 due to failure of conservative measures. Pt is currently R LE WBAT and no formal hip precautions. Pt PMH includes but is not limited to: ca, heart murmur and arthritis.  Clinical Impression    Sarahjean Hunte is a 77 y.o. female POD 0 s/p R THA. Patient reports IND with mobility at baseline. Patient is now limited by functional impairments (see PT problem list below) and requires min guard and cues for transfers and gait with RW. Patient was able to ambulate 40 feet with RW and min guard and cues for safe walker management with reports of pain increasing from 3/10 to 8/10 and dizziness. Gait discontinued and pt seated in recliner and Bp assessed 107/68. PPatient instructed in exercises to facilitate ROM and circulation reviewed in supine. Patient will benefit from continued skilled PT interventions to address impairments and progress towards PLOF. Patient has not met mobility goals for safe discharge home at this time; will continue to follow if pt continues acute stay to progress towards Mod I goals. Pt to return later in the day and reassess pt Bp, response and readiness for same day d/c home.      Recommendations for follow up therapy are one component of a multi-disciplinary discharge planning process, led by the attending physician.  Recommendations may be updated based on patient status, additional functional criteria and insurance authorization.  Follow Up Recommendations       Assistance Recommended at Discharge Intermittent Supervision/Assistance  Patient can return home with the following  A little help with walking and/or transfers;A little help with bathing/dressing/bathroom;Assistance with cooking/housework;Assist for transportation;Help with stairs or ramp for  entrance    Equipment Recommendations Rolling walker (2 wheels) (provided and adjusted at time of eval)  Recommendations for Other Services       Functional Status Assessment Patient has had a recent decline in their functional status and demonstrates the ability to make significant improvements in function in a reasonable and predictable amount of time.     Precautions / Restrictions Precautions Precautions: Fall Restrictions Weight Bearing Restrictions: No      Mobility  Bed Mobility Overal bed mobility: Needs Assistance Bed Mobility: Supine to Sit     Supine to sit: Min guard     General bed mobility comments: mn cues    Transfers Overall transfer level: Needs assistance Equipment used: Rolling walker (2 wheels) Transfers: Sit to/from Stand Sit to Stand: Min guard           General transfer comment: cues for proper UE placement    Ambulation/Gait Ambulation/Gait assistance: Min guard Gait Distance (Feet): 40 Feet Assistive device: Rolling walker (2 wheels) Gait Pattern/deviations: Step-to pattern, Antalgic Gait velocity: slightly decreased     General Gait Details: step almost through pattern, pt reported increased R LE pain during gait requiring 2 standing rest breaks, pt reported dizzines and light headedness with gait and discontinued  Stairs            Wheelchair Mobility    Modified Rankin (Stroke Patients Only)       Balance  Pertinent Vitals/Pain Pain Assessment Pain Assessment: 0-10 Pain Score: 8  (pain 3/10 at rest) Pain Location: R hip Pain Descriptors / Indicators: Constant, Discomfort, Aching, Operative site guarding Pain Intervention(s): Limited activity within patient's tolerance, Monitored during session, Premedicated before session, Repositioned, Ice applied    Home Living Family/patient expects to be discharged to:: Private residence Living Arrangements:  Alone;Children Available Help at Discharge: Family Type of Home: House (in [employer]) Home Access: Stairs to enter Entrance Stairs-Rails: None Entrance Stairs-Number of Steps: 2   Home Layout: One level Home Equipment: Cane - single point      Prior Function Prior Level of Function : Independent/Modified Independent             Mobility Comments: IND with all ADLs, self care tasks, IADLs and driving       Hand Dominance        Extremity/Trunk Assessment        Lower Extremity Assessment Lower Extremity Assessment: RLE deficits/detail RLE Deficits / Details: ankle DF/PF 5/5 RLE Sensation: WNL    Cervical / Trunk Assessment Cervical / Trunk Assessment:  (wfl)  Communication   Communication: HOH (wears hearing aids, R only at eval)  Cognition Arousal/Alertness: Awake/alert Behavior During Therapy: WFL for tasks assessed/performed Overall Cognitive Status: Within Functional Limits for tasks assessed                                          General Comments      Exercises Total Joint Exercises Ankle Circles/Pumps: AROM, Both, 20 reps Quad Sets: AROM, Right, 5 reps Heel Slides: AROM, Right, 5 reps Hip ABduction/ADduction: AROM, Right, 5 reps, Supine   Assessment/Plan    PT Assessment Patient needs continued PT services  PT Problem List Decreased strength;Decreased range of motion;Decreased activity tolerance;Decreased balance;Decreased mobility;Decreased coordination;Pain       PT Treatment Interventions DME instruction;Gait training;Stair training;Functional mobility training;Therapeutic activities;Therapeutic exercise;Balance training;Neuromuscular re-education;Patient/family education;Modalities    PT Goals (Current goals can be found in the Care Plan section)  Acute Rehab PT Goals Patient Stated Goal: to be able to walk my dog further and multiple times during the day PT Goal Formulation: With patient Time For Goal Achievement:  11/10/22 Potential to Achieve Goals: Good    Frequency 7X/week     Co-evaluation               AM-PAC PT "6 Clicks" Mobility  Outcome Measure Help needed turning from your back to your side while in a flat bed without using bedrails?: A Little Help needed moving from lying on your back to sitting on the side of a flat bed without using bedrails?: A Little Help needed moving to and from a bed to a chair (including a wheelchair)?: A Little Help needed standing up from a chair using your arms (e.g., wheelchair or bedside chair)?: A Little Help needed to walk in hospital room?: A Little Help needed climbing 3-5 steps with a railing? : Total 6 Click Score: 16    End of Session Equipment Utilized During Treatment: Gait belt Activity Tolerance: Treatment limited secondary to medical complications (Comment) (reports of dizziness) Patient left: in chair;with call bell/phone within reach;with family/visitor present Nurse Communication: Mobility status;Other (comment) (pt reports of increased pain and dizziness) PT Visit Diagnosis: Unsteadiness on feet (R26.81);Other abnormalities of gait and mobility (R26.89);Muscle weakness (generalized) (M62.81);Pain Pain - Right/Left: Right Pain - part  of body: Hip    Time: 6295-2841 PT Time Calculation (min) (ACUTE ONLY): 31 min   Charges:   PT Evaluation $PT Eval Low Complexity: 1 Low PT Treatments $Gait Training: 8-22 mins        Johnny Bridge, PT Acute Rehab   Jacqualyn Posey 10/27/2022, 5:12 PM

## 2022-11-02 ENCOUNTER — Encounter (HOSPITAL_COMMUNITY): Payer: Self-pay | Admitting: Orthopedic Surgery

## 2022-11-10 ENCOUNTER — Other Ambulatory Visit: Payer: Medicare (Managed Care)

## 2022-12-02 ENCOUNTER — Ambulatory Visit: Payer: Medicare (Managed Care)

## 2022-12-02 DIAGNOSIS — I340 Nonrheumatic mitral (valve) insufficiency: Secondary | ICD-10-CM

## 2022-12-02 DIAGNOSIS — I351 Nonrheumatic aortic (valve) insufficiency: Secondary | ICD-10-CM

## 2022-12-02 DIAGNOSIS — Z01810 Encounter for preprocedural cardiovascular examination: Secondary | ICD-10-CM

## 2022-12-07 NOTE — Progress Notes (Signed)
Normal echo with minor abnormality  Echocardiogram 12/02/2022: Left ventricle cavity is normal in size. Normal left ventricular wall thickness. Normal global wall motion. Normal LV systolic function with EF 61%. Doppler evidence of grade I (impaired) diastolic dysfunction, normal LAP.  Trileaflet aortic valve with aortic sclerosis. No significant stenosis or regurgitation. Mild (Grade I) mitral regurgitation. Mild tricuspid regurgitation. No evidence of pulmonary hypertension.

## 2022-12-07 NOTE — Progress Notes (Signed)
Called patient to inform her about her echo. Patient understood
